# Patient Record
Sex: Female | Born: 1945
Health system: Southern US, Community
[De-identification: ages and names within clinical notes are randomized; demographics above are authoritative.]

## PROBLEM LIST (undated history)

## (undated) DIAGNOSIS — N390 Urinary tract infection, site not specified: Secondary | ICD-10-CM

## (undated) DIAGNOSIS — E785 Hyperlipidemia, unspecified: Secondary | ICD-10-CM

## (undated) DIAGNOSIS — H524 Presbyopia: Secondary | ICD-10-CM

## (undated) DIAGNOSIS — I10 Essential (primary) hypertension: Secondary | ICD-10-CM

## (undated) DIAGNOSIS — E119 Type 2 diabetes mellitus without complications: Secondary | ICD-10-CM

## (undated) DIAGNOSIS — E7800 Pure hypercholesterolemia, unspecified: Secondary | ICD-10-CM

## (undated) HISTORY — DX: Presbyopia: H52.4

## (undated) HISTORY — DX: Type 2 diabetes mellitus without complications: E11.9

## (undated) HISTORY — DX: Essential (primary) hypertension: I10

## (undated) HISTORY — DX: Hyperlipidemia, unspecified: E78.5

## (undated) HISTORY — DX: Urinary tract infection, site not specified: N39.0

## (undated) HISTORY — DX: Pure hypercholesterolemia, unspecified: E78.00

## (undated) HISTORY — PX: EYE SURGERY: SHX253

---

## 2009-04-10 LAB — HM MAMMOGRAPHY

## 2010-05-30 LAB — LIPID PANEL
Cholesterol: 295 mg/dL — AB (ref 0–200)
LDL Cholesterol: 182 mg/dL
Triglycerides: 246 mg/dL — AB (ref 40–160)

## 2011-03-06 ENCOUNTER — Ambulatory Visit: Payer: Self-pay | Admitting: Physician Assistant

## 2011-04-02 ENCOUNTER — Encounter: Payer: Self-pay | Admitting: Physician Assistant

## 2011-04-02 DIAGNOSIS — E1159 Type 2 diabetes mellitus with other circulatory complications: Secondary | ICD-10-CM | POA: Insufficient documentation

## 2011-04-02 DIAGNOSIS — IMO0002 Reserved for concepts with insufficient information to code with codable children: Secondary | ICD-10-CM | POA: Insufficient documentation

## 2011-04-02 DIAGNOSIS — H409 Unspecified glaucoma: Secondary | ICD-10-CM | POA: Insufficient documentation

## 2011-04-02 DIAGNOSIS — I10 Essential (primary) hypertension: Secondary | ICD-10-CM | POA: Insufficient documentation

## 2011-04-02 DIAGNOSIS — E1165 Type 2 diabetes mellitus with hyperglycemia: Secondary | ICD-10-CM | POA: Insufficient documentation

## 2011-04-02 DIAGNOSIS — I152 Hypertension secondary to endocrine disorders: Secondary | ICD-10-CM | POA: Insufficient documentation

## 2011-04-02 DIAGNOSIS — E785 Hyperlipidemia, unspecified: Secondary | ICD-10-CM | POA: Insufficient documentation

## 2011-04-03 ENCOUNTER — Ambulatory Visit (INDEPENDENT_AMBULATORY_CARE_PROVIDER_SITE_OTHER): Payer: Medicare Other | Admitting: Physician Assistant

## 2011-04-03 DIAGNOSIS — E119 Type 2 diabetes mellitus without complications: Secondary | ICD-10-CM

## 2011-04-03 DIAGNOSIS — E782 Mixed hyperlipidemia: Secondary | ICD-10-CM

## 2011-04-03 DIAGNOSIS — I10 Essential (primary) hypertension: Secondary | ICD-10-CM

## 2011-05-07 ENCOUNTER — Telehealth: Payer: Self-pay

## 2011-05-07 NOTE — Telephone Encounter (Signed)
.  [facility] PT STATED Carly Moore WANTED TO KNOW WHEN SHE WAS BACK IN TOWN SO SHE CAN CALL IN HER MEDICINE FOR HER PLEASE CALL PT AT [phone]  [facility] ON [address]

## 2011-05-08 ENCOUNTER — Other Ambulatory Visit: Payer: Self-pay

## 2011-05-08 NOTE — Telephone Encounter (Signed)
What medicine does the patient need?

## 2011-05-08 NOTE — Telephone Encounter (Signed)
Pt needs refill on Metformin 1000 bid, Losartan 100mg , and HCTZ 25 mg called into [facility] on [address]

## 2011-05-08 NOTE — Telephone Encounter (Signed)
LMOM to CB. 

## 2011-05-09 MED ORDER — LOSARTAN POTASSIUM 100 MG PO TABS: 100.0000 mg | ORAL_TABLET | Freq: Every day | ORAL | Status: DC

## 2011-05-09 MED ORDER — METFORMIN HCL 1000 MG PO TABS: 1000.0000 mg | ORAL_TABLET | Freq: Two times a day (BID) | ORAL | Status: DC

## 2011-05-09 MED ORDER — HYDROCHLOROTHIAZIDE 25 MG PO TABS: 25.0000 mg | ORAL_TABLET | Freq: Every day | ORAL | Status: DC

## 2011-05-09 NOTE — Telephone Encounter (Signed)
Meds already addressed... See previous phone message.

## 2011-05-09 NOTE — Telephone Encounter (Signed)
Called pt to verify which Rxs are needed. Pt needs Metformin, Losartan and HCTZ in 90-day amounts. Sent Rxs in with one add'l RF each per protocol.

## 2011-10-02 ENCOUNTER — Ambulatory Visit: Payer: Medicare Other | Admitting: Physician Assistant

## 2011-10-16 ENCOUNTER — Ambulatory Visit: Payer: Medicare Other | Admitting: Physician Assistant

## 2011-10-21 ENCOUNTER — Other Ambulatory Visit: Payer: Self-pay | Admitting: Physician Assistant

## 2011-12-09 ENCOUNTER — Telehealth: Payer: Self-pay

## 2011-12-09 MED ORDER — HYDROCHLOROTHIAZIDE 25 MG PO TABS: 25.0000 mg | ORAL_TABLET | Freq: Every day | ORAL | Status: DC

## 2011-12-09 MED ORDER — METFORMIN HCL 1000 MG PO TABS: 1000.0000 mg | ORAL_TABLET | Freq: Two times a day (BID) | ORAL | Status: DC

## 2011-12-09 MED ORDER — LOSARTAN POTASSIUM 100 MG PO TABS: 100.0000 mg | ORAL_TABLET | Freq: Every day | ORAL | Status: DC

## 2011-12-09 NOTE — Telephone Encounter (Signed)
Patient has appt with Maralyn Sago scheduled, so her meds are sent in for her. She is aware.

## 2011-12-09 NOTE — Telephone Encounter (Signed)
Pt is in [place], [place] and will not be home until later in the week. She is out of all three of her medication, metformin, BP med and another med in which could not remember the name.  Pt has scheduled an appt with Benny Lennert on 12/25/11, but is out now. Please call pt at [phone]. If we can call in meds please call to [facility] in [place] ,[place] @ [phone]

## 2011-12-25 ENCOUNTER — Encounter: Payer: Self-pay | Admitting: Physician Assistant

## 2011-12-25 ENCOUNTER — Ambulatory Visit (INDEPENDENT_AMBULATORY_CARE_PROVIDER_SITE_OTHER): Payer: Medicare Other | Admitting: Physician Assistant

## 2011-12-25 VITALS — BP 140/72 | HR 66 | Temp 97.2°F | Resp 18 | Ht 60.0 in | Wt 170.0 lb

## 2011-12-25 DIAGNOSIS — E7800 Pure hypercholesterolemia, unspecified: Secondary | ICD-10-CM

## 2011-12-25 DIAGNOSIS — E119 Type 2 diabetes mellitus without complications: Secondary | ICD-10-CM

## 2011-12-25 DIAGNOSIS — Z9189 Other specified personal risk factors, not elsewhere classified: Secondary | ICD-10-CM

## 2011-12-25 DIAGNOSIS — Z23 Encounter for immunization: Secondary | ICD-10-CM

## 2011-12-25 DIAGNOSIS — IMO0001 Reserved for inherently not codable concepts without codable children: Secondary | ICD-10-CM

## 2011-12-25 DIAGNOSIS — Z789 Other specified health status: Secondary | ICD-10-CM

## 2011-12-25 LAB — LIPID PANEL
Cholesterol: 311 mg/dL — ABNORMAL HIGH (ref 0–200)
VLDL: 59 mg/dL — ABNORMAL HIGH (ref 0–40)

## 2011-12-25 LAB — COMPREHENSIVE METABOLIC PANEL
ALT: 25 U/L (ref 0–35)
CO2: 27 mEq/L (ref 19–32)
Chloride: 103 mEq/L (ref 96–112)
Potassium: 4.3 mEq/L (ref 3.5–5.3)
Sodium: 137 mEq/L (ref 135–145)
Total Bilirubin: 0.5 mg/dL (ref 0.3–1.2)
Total Protein: 7.3 g/dL (ref 6.0–8.3)

## 2011-12-25 MED ORDER — HYDROCHLOROTHIAZIDE 25 MG PO TABS: 25.0000 mg | ORAL_TABLET | Freq: Every day | ORAL | Status: DC

## 2011-12-25 MED ORDER — LOSARTAN POTASSIUM 100 MG PO TABS: 100.0000 mg | ORAL_TABLET | Freq: Every day | ORAL | Status: DC

## 2011-12-25 MED ORDER — METFORMIN HCL 1000 MG PO TABS: 1000.0000 mg | ORAL_TABLET | Freq: Two times a day (BID) | ORAL | Status: DC

## 2011-12-25 NOTE — Progress Notes (Signed)
   [address], [place] [address]   Phone [phone]  Subjective:    Patient ID: Carly Moore, female    DOB: [birth year], 66 y.o.   MRN: [id number]  HPI Pt presents for recheck of HTN, DM and hypercholesterol.  She has been away for most of the summer.  She has not been eating great and her sugars have been running slightly higher than normal (this am was 190).  She tries to normally eat mostly veggies but with visiting family her diet was not her normal.  She has also forgotten a few doses of her BP meds recently.  She is otherwise good.  Weight is stable.  Beck's depression scale - 2 - no signs of depression Fall risk assessment - low-no fall risk  Review of Systems  Constitutional: Negative for unexpected weight change.  Respiratory: Negative for cough and chest tightness.   Cardiovascular: Negative for chest pain and leg swelling.  Skin: Negative.   Neurological:       No feet paresthesias or burning.       Objective:   Physical Exam  Vitals reviewed. Constitutional: She is oriented to person, place, and time. She appears well-developed and well-nourished.  HENT:  Head: Normocephalic and atraumatic.  Right Ear: External ear normal.  Left Ear: External ear normal.  Nose: Nose normal.  Eyes: Conjunctivae normal are normal.  Neck: Neck supple.  Cardiovascular: Normal rate, regular rhythm and normal heart sounds.   Pulmonary/Chest: Effort normal and breath sounds normal.  Lymphadenopathy:    She has no cervical adenopathy.  Neurological: She is alert and oriented to person, place, and time.  Skin: Skin is warm and dry.       No skin breakdown on feet.  Some callus formation at MTP bilaterally.  Normal sensation with filament testing.  Psychiatric: She has a normal mood and affect. Her behavior is normal. Judgment and thought content normal.          Assessment & Plan:   1. DM (diabetes mellitus)  Comprehensive metabolic panel, Lipid panel, POCT glycosylated  hemoglobin (Hb A1C)  2. Hypercholesterolemia  Comprehensive metabolic panel, Lipid panel  3. Flu vaccine need  Flu vaccine greater than or equal to 3yo preservative free IM   Meds refilled.  Will plan on rechecking pt in 6 months unless her A1C is terrible, then will plan on 3 month recheck.  Pt will schedule her mammo. We did a referral for a bone density for screening. Pt declines pneumovax and tetanus vaccines and declines colonoscopy referral.

## 2012-07-02 ENCOUNTER — Other Ambulatory Visit: Payer: Self-pay | Admitting: Physician Assistant

## 2012-07-30 ENCOUNTER — Telehealth: Payer: Self-pay

## 2012-07-30 MED ORDER — HYDROCHLOROTHIAZIDE 25 MG PO TABS: 25.0000 mg | ORAL_TABLET | Freq: Every day | ORAL | Status: DC

## 2012-07-30 MED ORDER — LOSARTAN POTASSIUM 100 MG PO TABS: ORAL_TABLET | ORAL | Status: DC

## 2012-07-30 MED ORDER — METFORMIN HCL 1000 MG PO TABS: ORAL_TABLET | ORAL | Status: DC

## 2012-07-30 NOTE — Telephone Encounter (Signed)
Pt is scheduled to see Carly Moore on 09/02/12, but wants to know if medications for metformin, lorzaraton and her fluid pill be called in.  Pt is requesting a 3 month supply because she is traveling so much [facility] on [address]

## 2012-07-30 NOTE — Telephone Encounter (Signed)
Sent in one month supply can not send 3 months, since patient overdue for followup

## 2012-09-02 ENCOUNTER — Encounter: Payer: Self-pay | Admitting: Physician Assistant

## 2012-09-02 ENCOUNTER — Ambulatory Visit (INDEPENDENT_AMBULATORY_CARE_PROVIDER_SITE_OTHER): Payer: Medicare Other | Admitting: Physician Assistant

## 2012-09-02 VITALS — BP 163/83 | HR 69 | Temp 98.8°F | Resp 20 | Ht 60.0 in | Wt 169.8 lb

## 2012-09-02 DIAGNOSIS — E119 Type 2 diabetes mellitus without complications: Secondary | ICD-10-CM

## 2012-09-02 DIAGNOSIS — I10 Essential (primary) hypertension: Secondary | ICD-10-CM

## 2012-09-02 DIAGNOSIS — E785 Hyperlipidemia, unspecified: Secondary | ICD-10-CM

## 2012-09-02 LAB — COMPREHENSIVE METABOLIC PANEL
ALT: 33 U/L (ref 0–35)
CO2: 28 mEq/L (ref 19–32)
Creat: 0.92 mg/dL (ref 0.50–1.10)
Total Bilirubin: 0.5 mg/dL (ref 0.3–1.2)

## 2012-09-02 MED ORDER — LOSARTAN POTASSIUM 100 MG PO TABS: 100.0000 mg | ORAL_TABLET | Freq: Every day | ORAL | Status: DC

## 2012-09-02 MED ORDER — HYDROCHLOROTHIAZIDE 25 MG PO TABS: 25.0000 mg | ORAL_TABLET | Freq: Every day | ORAL | Status: DC

## 2012-09-02 MED ORDER — METFORMIN HCL 1000 MG PO TABS: ORAL_TABLET | ORAL | Status: DC

## 2012-09-02 NOTE — Progress Notes (Signed)
   [address], [place] [address]   Phone [phone]  Subjective:    Patient ID: Carly Moore, female    DOB: 07-28-1945, 67 y.o.   MRN: [id number]  HPI Pt presents to clinic for 6 month recheck on her controlled diabetes and HTN.  She is doing well.  She just got home from her 50th high school reunion.  She has been doing ok with her eating but she did have a glucose this am of 183 but she did not eat well the last couple of days due to her travels.    Review of Systems  Constitutional: Negative for fever and chills.  Respiratory: Negative.   Cardiovascular: Negative.   Gastrointestinal:       Heartburn every once in a while - eats an apple and it feels better.  Endocrine: Negative.        Objective:   Physical Exam  Vitals reviewed. Constitutional: She is oriented to person, place, and time. She appears well-developed and well-nourished.  HENT:  Head: Normocephalic and atraumatic.  Right Ear: External ear normal.  Left Ear: External ear normal.  Eyes: Conjunctivae are normal.  Cardiovascular: Normal rate, regular rhythm and normal heart sounds.   No murmur heard. Pulmonary/Chest: Effort normal and breath sounds normal.  Abdominal: Soft.  Neurological: She is alert and oriented to person, place, and time.  Skin: Skin is warm and dry.  Psychiatric: She has a normal mood and affect. Her behavior is normal. Judgment and thought content normal.     Results for orders placed in visit on 09/02/12  POCT GLYCOSYLATED HEMOGLOBIN (HGB A1C)      Result Value Range   Hemoglobin A1C 6.9          Assessment & Plan:  Diabetes mellitus, type II - good control and therefore will continue our q19month appts- Plan: POCT glycosylated hemoglobin (Hb A1C), Microalbumin, urine, Comprehensive metabolic panel, metFORMIN (GLUCOPHAGE) 1000 MG tablet  HTN (hypertension) - Plan: losartan (COZAAR) 100 MG tablet, hydrochlorothiazide (HYDRODIURIL) 25 MG tablet  Benny Lennert PA-C 09/02/2012  5:13 PM

## 2012-09-03 LAB — MICROALBUMIN, URINE: Microalb, Ur: 1.7 mg/dL (ref 0.00–1.89)

## 2013-03-04 ENCOUNTER — Other Ambulatory Visit: Payer: Self-pay | Admitting: Physician Assistant

## 2013-04-04 ENCOUNTER — Other Ambulatory Visit: Payer: Self-pay | Admitting: Physician Assistant

## 2013-04-28 ENCOUNTER — Ambulatory Visit (INDEPENDENT_AMBULATORY_CARE_PROVIDER_SITE_OTHER): Payer: Medicare HMO | Admitting: Physician Assistant

## 2013-04-28 ENCOUNTER — Encounter: Payer: Self-pay | Admitting: Physician Assistant

## 2013-04-28 VITALS — BP 152/74 | HR 72 | Temp 98.7°F | Resp 16 | Ht 60.0 in | Wt 171.0 lb

## 2013-04-28 DIAGNOSIS — Z23 Encounter for immunization: Secondary | ICD-10-CM

## 2013-04-28 DIAGNOSIS — E785 Hyperlipidemia, unspecified: Secondary | ICD-10-CM

## 2013-04-28 DIAGNOSIS — I10 Essential (primary) hypertension: Secondary | ICD-10-CM

## 2013-04-28 DIAGNOSIS — E119 Type 2 diabetes mellitus without complications: Secondary | ICD-10-CM

## 2013-04-28 DIAGNOSIS — Z0000 Encounter for general adult medical examination without abnormal findings: Secondary | ICD-10-CM

## 2013-04-28 DIAGNOSIS — R5381 Other malaise: Secondary | ICD-10-CM

## 2013-04-28 DIAGNOSIS — Z1239 Encounter for other screening for malignant neoplasm of breast: Secondary | ICD-10-CM

## 2013-04-28 DIAGNOSIS — R5383 Other fatigue: Secondary | ICD-10-CM

## 2013-04-28 LAB — COMPLETE METABOLIC PANEL WITH GFR
ALT: 25 U/L (ref 0–35)
AST: 19 U/L (ref 0–37)
Albumin: 4.5 g/dL (ref 3.5–5.2)
Alkaline Phosphatase: 68 U/L (ref 39–117)
BUN: 15 mg/dL (ref 6–23)
CALCIUM: 9.7 mg/dL (ref 8.4–10.5)
CHLORIDE: 101 meq/L (ref 96–112)
CO2: 23 mEq/L (ref 19–32)
CREATININE: 0.9 mg/dL (ref 0.50–1.10)
GFR, EST AFRICAN AMERICAN: 76 mL/min
GFR, Est Non African American: 66 mL/min
Glucose, Bld: 149 mg/dL — ABNORMAL HIGH (ref 70–99)
Potassium: 4 mEq/L (ref 3.5–5.3)
Sodium: 138 mEq/L (ref 135–145)
Total Bilirubin: 0.4 mg/dL (ref 0.2–1.2)
Total Protein: 7.1 g/dL (ref 6.0–8.3)

## 2013-04-28 LAB — LIPID PANEL
CHOLESTEROL: 254 mg/dL — AB (ref 0–200)
HDL: 60 mg/dL (ref >39)
LDL Cholesterol: 145 mg/dL — ABNORMAL HIGH (ref 0–99)
TRIGLYCERIDES: 246 mg/dL — AB (ref <150)
Total CHOL/HDL Ratio: 4.2 Ratio
VLDL: 49 mg/dL — ABNORMAL HIGH (ref 0–40)

## 2013-04-28 LAB — CBC
HEMATOCRIT: 41.2 % (ref 36.0–46.0)
Hemoglobin: 14.3 g/dL (ref 12.0–15.0)
MCH: 31.6 pg (ref 26.0–34.0)
MCHC: 34.7 g/dL (ref 30.0–36.0)
MCV: 91.2 fL (ref 78.0–100.0)
Platelets: 288 10*3/uL (ref 150–400)
RBC: 4.52 MIL/uL (ref 3.87–5.11)
RDW: 13.7 % (ref 11.5–15.5)
WBC: 8.3 10*3/uL (ref 4.0–10.5)

## 2013-04-28 LAB — POCT URINALYSIS DIPSTICK
Bilirubin, UA: NEGATIVE
Blood, UA: NEGATIVE
GLUCOSE UA: NEGATIVE
Ketones, UA: NEGATIVE
Nitrite, UA: NEGATIVE
PH UA: 6
Protein, UA: NEGATIVE
SPEC GRAV UA: 1.015
Urobilinogen, UA: 0.2

## 2013-04-28 LAB — POCT GLYCOSYLATED HEMOGLOBIN (HGB A1C): HEMOGLOBIN A1C: 7.4

## 2013-04-28 LAB — TSH: TSH: 1.16 u[IU]/mL (ref 0.350–4.500)

## 2013-04-28 MED ORDER — METFORMIN HCL 1000 MG PO TABS: ORAL_TABLET | ORAL | Status: DC

## 2013-04-28 MED ORDER — HYDROCHLOROTHIAZIDE 25 MG PO TABS: ORAL_TABLET | ORAL | Status: DC

## 2013-04-28 MED ORDER — LOSARTAN POTASSIUM 100 MG PO TABS: ORAL_TABLET | ORAL | Status: DC

## 2013-04-28 NOTE — Patient Instructions (Signed)
[facility] [address], [place], [place] [id number] [phone]

## 2013-04-29 NOTE — Progress Notes (Signed)
Subjective:    Patient ID: Carly Moore, female    DOB: 10-18-1945, 68 y.o.   MRN: [id number]  HPI  Pt presents to clinic for her annual exam.  She is doing well.  She has been traveling with her cousins and friends.  Patient Active Problem List   Diagnosis Date Noted  . Glaucoma - see eye dr every 6 months   . HTN (hypertension) - controlled on medications - she will monitor at home due to slight increase at today's visit   . Diabetes type 2, uncontrolled -- she will work on improved control with changes in her diet   . Hyperlipidemia - she still does not desire treatment at this time   Se is not interested in Colonoscopy. Last Bone density 10/13 - normal Not interested in pneumococcal vaccine. See dentist every 6 months Last mammogram was several years ago at [facility].  Review of Systems  Constitutional: Negative.   HENT: Negative.   Eyes: Negative.   Respiratory: Negative.   Cardiovascular: Negative.   Gastrointestinal: Negative.   Endocrine: Negative.   Genitourinary: Negative.   Musculoskeletal: Negative.   Skin: Negative.   Allergic/Immunologic: Negative.   Neurological: Negative.   Hematological: Negative.   Psychiatric/Behavioral: Negative.        Objective:   Physical Exam  Vitals reviewed. Constitutional: She is oriented to person, place, and time. She appears well-developed and well-nourished.  HENT:  Head: Normocephalic and atraumatic.  Right Ear: Hearing, tympanic membrane, external ear and ear canal normal.  Left Ear: Hearing, tympanic membrane, external ear and ear canal normal.  Nose: Nose normal.  Mouth/Throat: Uvula is midline, oropharynx is clear and moist and mucous membranes are normal.  Eyes: Conjunctivae and EOM are normal. Pupils are equal, round, and reactive to light.  Neck: Normal range of motion. Neck supple.  Cardiovascular: Normal rate, regular rhythm and normal heart sounds.  Exam reveals no gallop.   Pulmonary/Chest: Effort normal  and breath sounds normal.  Abdominal: Soft. Bowel sounds are normal.  Genitourinary: No breast swelling, tenderness, discharge or bleeding.  Musculoskeletal: Normal range of motion.  Lymphadenopathy:    She has no cervical adenopathy.  Neurological: She is alert and oriented to person, place, and time. She has normal reflexes.  Skin: Skin is warm and dry.  Psychiatric: She has a normal mood and affect. Her behavior is normal. Judgment and thought content normal.   Results for orders placed in visit on 04/28/13  COMPLETE METABOLIC PANEL WITH GFR      Result Value Range   Sodium 138  135 - 145 mEq/L   Potassium 4.0  3.5 - 5.3 mEq/L   Chloride 101  96 - 112 mEq/L   CO2 23  19 - 32 mEq/L   Glucose, Bld 149 (*) 70 - 99 mg/dL   BUN 15  6 - 23 mg/dL   Creat 9.560.90  2.130.50 - 0.861.10 mg/dL   Total Bilirubin 0.4  0.2 - 1.2 mg/dL   Alkaline Phosphatase 68  39 - 117 U/L   AST 19  0 - 37 U/L   ALT 25  0 - 35 U/L   Total Protein 7.1  6.0 - 8.3 g/dL   Albumin 4.5  3.5 - 5.2 g/dL   Calcium 9.7  8.4 - 57.810.5 mg/dL   GFR, Est African American 76     GFR, Est Non African American 66    TSH      Result Value Range   TSH 1.160  0.350 - 4.500 uIU/mL  LIPID PANEL      Result Value Range   Cholesterol 254 (*) 0 - 200 mg/dL   Triglycerides 960 (*) <150 mg/dL   HDL 60  >45 mg/dL   Total CHOL/HDL Ratio 4.2     VLDL 49 (*) 0 - 40 mg/dL   LDL Cholesterol 409 (*) 0 - 99 mg/dL  CBC      Result Value Range   WBC 8.3  4.0 - 10.5 K/uL   RBC 4.52  3.87 - 5.11 MIL/uL   Hemoglobin 14.3  12.0 - 15.0 g/dL   HCT 81.1  91.4 - 78.2 %   MCV 91.2  78.0 - 100.0 fL   MCH 31.6  26.0 - 34.0 pg   MCHC 34.7  30.0 - 36.0 g/dL   RDW 95.6  21.3 - 08.6 %   Platelets 288  150 - 400 K/uL  POCT GLYCOSYLATED HEMOGLOBIN (HGB A1C)      Result Value Range   Hemoglobin A1C 7.4    POCT URINALYSIS DIPSTICK      Result Value Range   Color, UA yellow     Clarity, UA clear     Glucose, UA neg     Bilirubin, UA neg     Ketones, UA  neg     Spec Grav, UA 1.015     Blood, UA neg     pH, UA 6.0     Protein, UA neg     Urobilinogen, UA 0.2     Nitrite, UA neg     Leukocytes, UA Trace         Assessment & Plan:  Medicare annual wellness visit, subsequent  Hyperlipidemia - Plan: Lipid panel  HTN (hypertension) - slightly high today - pt will continue to monitor at home - Plan: COMPLETE METABOLIC PANEL WITH GFR, hydrochlorothiazide (HYDRODIURIL) 25 MG tablet, losartan (COZAAR) 100 MG tablet  Diabetes type 2, controlled - slightly worse control at this visit - she will watch her eating -- Plan: POCT glycosylated hemoglobin (Hb A1C), CBC, POCT urinalysis dipstick, metFORMIN (GLUCOPHAGE) 1000 MG tablet, HM DIABETES FOOT EXAM  Other malaise and fatigue - Plan: TSH  Screening for breast cancer - Plan: MM Digital Screening  Need for prophylactic vaccination and inoculation against influenza - Plan: Flu Vaccine QUAD 36+ mos IM  Still ok to see her in 3 months due to elevated BP.  Benny Lennert PA-C 04/29/2013 5:27 PM

## 2013-05-12 ENCOUNTER — Encounter: Payer: Self-pay | Admitting: Physician Assistant

## 2013-06-09 ENCOUNTER — Encounter: Payer: Self-pay | Admitting: Physician Assistant

## 2013-07-29 ENCOUNTER — Other Ambulatory Visit: Payer: Self-pay | Admitting: Physician Assistant

## 2013-08-04 ENCOUNTER — Ambulatory Visit (INDEPENDENT_AMBULATORY_CARE_PROVIDER_SITE_OTHER): Payer: Medicare HMO | Admitting: Physician Assistant

## 2013-08-04 VITALS — BP 153/75 | HR 61 | Temp 98.1°F | Resp 16 | Ht 60.0 in | Wt 169.0 lb

## 2013-08-04 DIAGNOSIS — I10 Essential (primary) hypertension: Secondary | ICD-10-CM

## 2013-08-04 DIAGNOSIS — E1165 Type 2 diabetes mellitus with hyperglycemia: Secondary | ICD-10-CM

## 2013-08-04 DIAGNOSIS — IMO0002 Reserved for concepts with insufficient information to code with codable children: Secondary | ICD-10-CM

## 2013-08-04 DIAGNOSIS — IMO0001 Reserved for inherently not codable concepts without codable children: Secondary | ICD-10-CM

## 2013-08-04 LAB — POCT GLYCOSYLATED HEMOGLOBIN (HGB A1C): Hemoglobin A1C: 7.3

## 2013-08-04 NOTE — Progress Notes (Signed)
   Subjective:    Patient ID: Carly Moore, female    DOB: Oct 17, 1945, 68 y.o.   MRN: [id number]  HPI  Pt presents to clinic for her 3 months recheck.  Her A1C at her last visit is higher than it normally is but she has been watching her sugar intake a little closer. She has been traveling to visit family and has multiple more trips planned for the summer.  She does not check her glucose or her BP at home.  She feels good.  Review of Systems  Cardiovascular: Negative for chest pain and leg swelling.       Objective:   Physical Exam  Vitals reviewed. Constitutional: She is oriented to person, place, and time. She appears well-developed and well-nourished.  HENT:  Head: Normocephalic and atraumatic.  Right Ear: External ear normal.  Left Ear: External ear normal.  Eyes: Conjunctivae are normal.  Neck: Normal range of motion.  Cardiovascular: Normal rate, regular rhythm and normal heart sounds.   No murmur heard. Pulmonary/Chest: Effort normal and breath sounds normal. She has no wheezes.  Musculoskeletal:  Normal DM foot exam  Neurological: She is alert and oriented to person, place, and time.  Skin: Skin is warm and dry.  Psychiatric: She has a normal mood and affect. Her behavior is normal. Judgment and thought content normal.   Results for orders placed in visit on 08/04/13  POCT GLYCOSYLATED HEMOGLOBIN (HGB A1C)      Result Value Ref Range   Hemoglobin A1C 7.3         Assessment & Plan:  Diabetes type 2, uncontrolled - She states that she is using Glucophage 100mg  in the am and 500mg  in the pm.  Her control is not where it should be but she states that she feels better on this reduced dose so I think that if she feels better it is ok to continue and we will see her back in 6 months unless she needs me sooner.  Plan: COMPLETE METABOLIC PANEL WITH GFR, POCT glycosylated hemoglobin (Hb A1C), HM DIABETES FOOT EXAM  HTN (hypertension) - her systolic is slightly elevated but  with nor diastolic I am concerned about adding other medications because I do not want her to experience hypotension and possible fall and get hurt.  Benny LennertSarah Blossie Raffel PA-C  [facility] 08/04/2013 2:51 PM

## 2013-08-05 LAB — COMPLETE METABOLIC PANEL WITH GFR
ALBUMIN: 4.5 g/dL (ref 3.5–5.2)
ALK PHOS: 75 U/L (ref 39–117)
ALT: 25 U/L (ref 0–35)
AST: 21 U/L (ref 0–37)
BUN: 15 mg/dL (ref 6–23)
CO2: 25 meq/L (ref 19–32)
Calcium: 9.7 mg/dL (ref 8.4–10.5)
Chloride: 101 mEq/L (ref 96–112)
Creat: 0.96 mg/dL (ref 0.50–1.10)
GFR, EST NON AFRICAN AMERICAN: 61 mL/min
GFR, Est African American: 70 mL/min
GLUCOSE: 142 mg/dL — AB (ref 70–99)
Potassium: 4.3 mEq/L (ref 3.5–5.3)
SODIUM: 140 meq/L (ref 135–145)
TOTAL PROTEIN: 7.7 g/dL (ref 6.0–8.3)
Total Bilirubin: 0.5 mg/dL (ref 0.2–1.2)

## 2013-08-18 ENCOUNTER — Ambulatory Visit: Payer: Medicare HMO | Admitting: Physician Assistant

## 2013-10-26 ENCOUNTER — Other Ambulatory Visit: Payer: Self-pay | Admitting: Physician Assistant

## 2014-01-31 ENCOUNTER — Other Ambulatory Visit: Payer: Self-pay | Admitting: Physician Assistant

## 2014-02-03 ENCOUNTER — Ambulatory Visit (INDEPENDENT_AMBULATORY_CARE_PROVIDER_SITE_OTHER): Payer: Medicare HMO

## 2014-02-03 ENCOUNTER — Ambulatory Visit (INDEPENDENT_AMBULATORY_CARE_PROVIDER_SITE_OTHER): Payer: Medicare HMO | Admitting: Family Medicine

## 2014-02-03 ENCOUNTER — Ambulatory Visit: Payer: Medicare HMO | Admitting: Physician Assistant

## 2014-02-03 VITALS — BP 140/70 | HR 59 | Temp 98.0°F | Resp 16 | Ht 60.0 in | Wt 170.6 lb

## 2014-02-03 DIAGNOSIS — IMO0002 Reserved for concepts with insufficient information to code with codable children: Secondary | ICD-10-CM

## 2014-02-03 DIAGNOSIS — M79645 Pain in left finger(s): Secondary | ICD-10-CM

## 2014-02-03 DIAGNOSIS — I10 Essential (primary) hypertension: Secondary | ICD-10-CM

## 2014-02-03 DIAGNOSIS — Z23 Encounter for immunization: Secondary | ICD-10-CM

## 2014-02-03 DIAGNOSIS — E1165 Type 2 diabetes mellitus with hyperglycemia: Secondary | ICD-10-CM

## 2014-02-03 LAB — COMPLETE METABOLIC PANEL WITH GFR
ALBUMIN: 4.2 g/dL (ref 3.5–5.2)
ALT: 22 U/L (ref 0–35)
AST: 19 U/L (ref 0–37)
Alkaline Phosphatase: 70 U/L (ref 39–117)
BUN: 9 mg/dL (ref 6–23)
CALCIUM: 9.9 mg/dL (ref 8.4–10.5)
CO2: 23 mEq/L (ref 19–32)
Chloride: 104 mEq/L (ref 96–112)
Creat: 0.95 mg/dL (ref 0.50–1.10)
GFR, EST AFRICAN AMERICAN: 71 mL/min
GFR, Est Non African American: 61 mL/min
Glucose, Bld: 134 mg/dL — ABNORMAL HIGH (ref 70–99)
POTASSIUM: 4.4 meq/L (ref 3.5–5.3)
Sodium: 138 mEq/L (ref 135–145)
Total Bilirubin: 0.4 mg/dL (ref 0.2–1.2)
Total Protein: 7.1 g/dL (ref 6.0–8.3)

## 2014-02-03 LAB — POCT GLYCOSYLATED HEMOGLOBIN (HGB A1C): Hemoglobin A1C: 7.7

## 2014-02-03 MED ORDER — METFORMIN HCL 1000 MG PO TABS: ORAL_TABLET | ORAL | Status: DC

## 2014-02-03 MED ORDER — LOSARTAN POTASSIUM 100 MG PO TABS: 100.0000 mg | ORAL_TABLET | Freq: Every day | ORAL | Status: DC

## 2014-02-03 MED ORDER — HYDROCHLOROTHIAZIDE 25 MG PO TABS: ORAL_TABLET | ORAL | Status: DC

## 2014-02-03 NOTE — Progress Notes (Signed)
   Subjective:    Patient ID: Carly Moore, female    DOB: 09/19/1945, 68 y.o.   MRN: [id number]  HPI Pt presents to clinic for her 6 month DM check and pain in her left middle finger since June when she tripped on a rug and landed on it funny.  She has had a good summer - doing her normal amount of traveling - she knows that her diet has not been the best but she also knows that it is not the worst that it has been.  She has an aching pain in her finger at the PIP and MCP of the 3rd digit - she notices some swelling and stiffness and generalized weakness but it is not stopping her from doing anything and it is not bad enough for her to take medications.   Review of Systems  Respiratory: Negative for cough.   Cardiovascular: Negative for chest pain and leg swelling.  Musculoskeletal: Positive for joint swelling.       Objective:   Physical Exam  Constitutional: She is oriented to person, place, and time. She appears well-developed and well-nourished.  BP 140/70 mmHg  Pulse 59  Temp(Src) 98 F (36.7 C) (Oral)  Resp 16  Ht 5' (1.524 m)  Wt 170 lb 9.6 oz (77.384 kg)  BMI 33.32 kg/m2  SpO2 96%   HENT:  Head: Normocephalic and atraumatic.  Right Ear: External ear normal.  Left Ear: External ear normal.  Eyes: Conjunctivae are normal.  Cardiovascular: Normal rate, regular rhythm and normal heart sounds.   No murmur heard. Pulmonary/Chest: Effort normal. She has no wheezes.  Musculoskeletal:       Left hand: She exhibits decreased range of motion (slight decrease in flexion of the PIP), tenderness (over MCP and radial aspect of her PIP -- left 3rd digit) and swelling (mild around MCP 3rd digit ). Normal strength noted. She exhibits no thumb/finger opposition.  Slightly decrease hand grip on the left compared to right (she is R hand dominant)  Neurological: She is alert and oriented to person, place, and time.  Skin: Skin is warm and dry.  Normal DM foot exam  Psychiatric: She has  a normal mood and affect. Her behavior is normal. Judgment and thought content normal.  Vitals reviewed.  [facility] reading (PRIMARY) by  Dr. Katrinka BlazingSmith.  neg  Results for orders placed or performed in visit on 02/03/14  POCT glycosylated hemoglobin (Hb A1C)  Result Value Ref Range   Hemoglobin A1C 7.7        Assessment & Plan:  Diabetes type 2, uncontrolled - Slightly worse DM control.  She is going to work more on her healthy lifestyle.  Plan: POCT glycosylated hemoglobin (Hb A1C), metFORMIN (GLUCOPHAGE) 1000 MG tablet, HM DIABETES FOOT EXAM  Need for influenza vaccination - Plan: Flu Vaccine QUAD 36+ mos IM  Essential hypertension - Plan: COMPLETE METABOLIC PANEL WITH GFR, losartan (COZAAR) 100 MG tablet, hydrochlorothiazide (HYDRODIURIL) 25 MG tablet  Finger pain, left - Plan: DG Finger Middle Left - there is no abnl on xrays - she will continue to monitor.  Offered Hand PT today and patients wants to wait.  Benny LennertSarah Weber PA-C  [facility] 02/03/2014 7:01 PM

## 2014-02-06 NOTE — Progress Notes (Signed)
History and physical examinations reviewed with Carly LennertSarah Weber, PA-C.  Xray reviewed and negative for acute process.  Agree with assessment and plan.

## 2014-07-01 ENCOUNTER — Telehealth: Payer: Self-pay

## 2014-07-01 NOTE — Telephone Encounter (Signed)
LMOM to CB. Received a fax from [employer]. Dr. Katrinka BlazingSmith wanted us to give pt a call and let her know she is due for a follow up with Maralyn SagoSarah. [employer] form in [employer] box.

## 2014-07-04 NOTE — Telephone Encounter (Signed)
Spoke with pt. She will come in this Friday to see Maralyn SagoSarah.

## 2014-09-03 ENCOUNTER — Ambulatory Visit (INDEPENDENT_AMBULATORY_CARE_PROVIDER_SITE_OTHER): Payer: Medicare HMO | Admitting: Physician Assistant

## 2014-09-03 VITALS — BP 124/80 | HR 64 | Temp 98.5°F | Ht 60.0 in | Wt 169.5 lb

## 2014-09-03 DIAGNOSIS — E1165 Type 2 diabetes mellitus with hyperglycemia: Secondary | ICD-10-CM | POA: Diagnosis not present

## 2014-09-03 DIAGNOSIS — I10 Essential (primary) hypertension: Secondary | ICD-10-CM

## 2014-09-03 DIAGNOSIS — IMO0002 Reserved for concepts with insufficient information to code with codable children: Secondary | ICD-10-CM

## 2014-09-03 LAB — COMPLETE METABOLIC PANEL WITH GFR
ALT: 29 U/L (ref 0–35)
AST: 21 U/L (ref 0–37)
Albumin: 4.3 g/dL (ref 3.5–5.2)
Alkaline Phosphatase: 70 U/L (ref 39–117)
BUN: 11 mg/dL (ref 6–23)
CO2: 26 mEq/L (ref 19–32)
CREATININE: 1.04 mg/dL (ref 0.50–1.10)
Calcium: 9.3 mg/dL (ref 8.4–10.5)
Chloride: 103 mEq/L (ref 96–112)
GFR, Est African American: 63 mL/min
GFR, Est Non African American: 55 mL/min — ABNORMAL LOW
GLUCOSE: 202 mg/dL — AB (ref 70–99)
Potassium: 4.2 mEq/L (ref 3.5–5.3)
Sodium: 139 mEq/L (ref 135–145)
Total Bilirubin: 0.4 mg/dL (ref 0.2–1.2)
Total Protein: 7 g/dL (ref 6.0–8.3)

## 2014-09-03 LAB — POCT GLYCOSYLATED HEMOGLOBIN (HGB A1C): Hemoglobin A1C: 8.3

## 2014-09-03 MED ORDER — HYDROCHLOROTHIAZIDE 25 MG PO TABS: ORAL_TABLET | ORAL | Status: DC

## 2014-09-03 MED ORDER — METFORMIN HCL 1000 MG PO TABS: ORAL_TABLET | ORAL | Status: DC

## 2014-09-03 MED ORDER — LOSARTAN POTASSIUM 100 MG PO TABS: 100.0000 mg | ORAL_TABLET | Freq: Every day | ORAL | Status: DC

## 2014-09-03 NOTE — Patient Instructions (Signed)
Keep up the good work

## 2014-09-03 NOTE — Progress Notes (Signed)
   Carly Moore  MRN: [id number] DOB: May 15, 1945  Subjective:  Pt presents to clinic for a recheck and medical refill for her DM and HTN.  She has been doing well and has no complaints.  She has not been traveling as much due to daughter in law recently moved in when they sold their house really fast prior to their move.   Patient Active Problem List   Diagnosis Date Noted  . Glaucoma   . HTN (hypertension)   . Diabetes type 2, uncontrolled   . Hyperlipidemia     Current Outpatient Prescriptions on File Prior to Visit  Medication Sig Dispense Refill  . Calcium-Magnesium-Vitamin D (CALCIUM MAGNESIUM PO) Take by mouth daily.    Marland Kitchen co-enzyme Q-10 30 MG capsule Take 30 mg by mouth 3 (three) times daily.      . fish oil-omega-3 fatty acids 1000 MG capsule Take 2 g by mouth daily.      Marland Kitchen glucosamine-chondroitin 500-400 MG tablet Take 1 tablet by mouth 3 (three) times daily.      Marland Kitchen VITAMIN D, CHOLECALCIFEROL, PO Take by mouth daily.     No current facility-administered medications on file prior to visit.    Allergies  Allergen Reactions  . Sulfa Antibiotics Rash    Review of Systems  Respiratory: Negative for cough.   Cardiovascular: Negative for chest pain and palpitations.  Endocrine: Negative.   Genitourinary: Negative.    Objective:  BP 124/80 mmHg  Pulse 64  Temp(Src) 98.5 F (36.9 C) (Oral)  Ht 5' (1.524 m)  Wt 169 lb 8 oz (76.885 kg)  BMI 33.10 kg/m2  SpO2 98%  Physical Exam  Constitutional: She is oriented to person, place, and time and well-developed, well-nourished, and in no distress.  HENT:  Head: Normocephalic and atraumatic.  Right Ear: External ear normal.  Left Ear: External ear normal.  Cardiovascular: Normal rate, regular rhythm and normal heart sounds.   Pulmonary/Chest: Effort normal and breath sounds normal.  Abdominal: Soft.  Neurological: She is alert and oriented to person, place, and time. Gait normal.  Diabetic Foot Exam - Simple   Simple  Foot Form  Diabetic Foot exam was performed with the following findings:  Yes   Visual Inspection  No deformities, no ulcerations, no other skin breakdown bilaterally:  Yes  Sensation Testing  Intact to touch and monofilament testing bilaterally:  Yes  Pulse Check  Posterior Tibialis and Dorsalis pulse intact bilaterally:  Yes  Comments   Skin: Skin is warm and dry.  Psychiatric: Mood, memory, affect and judgment normal.    Assessment and Plan :  Essential hypertension - Plan: COMPLETE METABOLIC PANEL WITH GFR, hydrochlorothiazide (HYDRODIURIL) 25 MG tablet, losartan (COZAAR) 100 MG tablet  Diabetes type 2, uncontrolled - Plan: POCT glycosylated hemoglobin (Hb A1C), metFORMIN (GLUCOPHAGE) 1000 MG tablet, HM DIABETES FOOT EXAM   Continue current medications.  Pt declines cholesterol testing due to she does not plan on treating it.  She will f/u in 6 months due to good control unless her A1C is really high when her labs return.  Benny Lennert PA-C  [facility] 09/03/2014 3:35 PM

## 2014-09-04 ENCOUNTER — Encounter: Payer: Self-pay | Admitting: Physician Assistant

## 2014-10-27 ENCOUNTER — Encounter: Payer: Self-pay | Admitting: *Deleted

## 2015-02-09 DIAGNOSIS — H401131 Primary open-angle glaucoma, bilateral, mild stage: Secondary | ICD-10-CM | POA: Diagnosis not present

## 2015-02-09 DIAGNOSIS — H35371 Puckering of macula, right eye: Secondary | ICD-10-CM | POA: Diagnosis not present

## 2015-02-09 DIAGNOSIS — E119 Type 2 diabetes mellitus without complications: Secondary | ICD-10-CM | POA: Diagnosis not present

## 2015-02-09 LAB — HM DIABETES EYE EXAM

## 2015-02-24 ENCOUNTER — Other Ambulatory Visit: Payer: Self-pay | Admitting: Physician Assistant

## 2015-03-07 ENCOUNTER — Ambulatory Visit (INDEPENDENT_AMBULATORY_CARE_PROVIDER_SITE_OTHER): Payer: Medicare HMO | Admitting: Physician Assistant

## 2015-03-07 ENCOUNTER — Encounter: Payer: Self-pay | Admitting: Physician Assistant

## 2015-03-07 VITALS — BP 138/70 | HR 85 | Temp 98.3°F | Resp 16 | Ht 60.0 in | Wt 163.0 lb

## 2015-03-07 DIAGNOSIS — IMO0001 Reserved for inherently not codable concepts without codable children: Secondary | ICD-10-CM

## 2015-03-07 DIAGNOSIS — Z23 Encounter for immunization: Secondary | ICD-10-CM

## 2015-03-07 DIAGNOSIS — E1165 Type 2 diabetes mellitus with hyperglycemia: Secondary | ICD-10-CM | POA: Diagnosis not present

## 2015-03-07 DIAGNOSIS — I10 Essential (primary) hypertension: Secondary | ICD-10-CM | POA: Diagnosis not present

## 2015-03-07 DIAGNOSIS — E119 Type 2 diabetes mellitus without complications: Secondary | ICD-10-CM | POA: Diagnosis not present

## 2015-03-07 LAB — COMPLETE METABOLIC PANEL WITH GFR
ALT: 25 U/L (ref 6–29)
AST: 21 U/L (ref 10–35)
Albumin: 4 g/dL (ref 3.6–5.1)
Alkaline Phosphatase: 73 U/L (ref 33–130)
BILIRUBIN TOTAL: 0.5 mg/dL (ref 0.2–1.2)
BUN: 17 mg/dL (ref 7–25)
CO2: 26 mmol/L (ref 20–31)
Calcium: 9.9 mg/dL (ref 8.6–10.4)
Chloride: 97 mmol/L — ABNORMAL LOW (ref 98–110)
Creat: 1.1 mg/dL — ABNORMAL HIGH (ref 0.60–0.93)
GFR, EST NON AFRICAN AMERICAN: 51 mL/min — AB (ref >=60)
GFR, Est African American: 59 mL/min — ABNORMAL LOW (ref >=60)
GLUCOSE: 217 mg/dL — AB (ref 65–99)
Potassium: 4.5 mmol/L (ref 3.5–5.3)
SODIUM: 137 mmol/L (ref 135–146)
TOTAL PROTEIN: 7.1 g/dL (ref 6.1–8.1)

## 2015-03-07 MED ORDER — METFORMIN HCL 1000 MG PO TABS: ORAL_TABLET | ORAL | Status: DC

## 2015-03-07 MED ORDER — LOSARTAN POTASSIUM 100 MG PO TABS: 100.0000 mg | ORAL_TABLET | Freq: Every day | ORAL | Status: DC

## 2015-03-07 MED ORDER — HYDROCHLOROTHIAZIDE 25 MG PO TABS: ORAL_TABLET | ORAL | Status: DC

## 2015-03-07 NOTE — Progress Notes (Signed)
Carly Moore  MRN: [id number] DOB: September 18, 1945  Subjective:  Pt presents to clinic for her 6 months recheck for her DM and HTN.  She is doing well and has no complaints.  She is having problems with dry eyes and she is working with her eye doctor on these.  She is trying to get off the metformin by doing high fiber and high protein shakes - she finds when she does these her glucose is more stable -   Patient Active Problem List   Diagnosis Date Noted  . Glaucoma   . HTN (hypertension)   . Diabetes type 2, uncontrolled (HCC)   . Hyperlipidemia     Current Outpatient Prescriptions on File Prior to Visit  Medication Sig Dispense Refill  . Calcium-Magnesium-Vitamin D (CALCIUM MAGNESIUM PO) Take by mouth daily.    Marland Kitchen co-enzyme Q-10 30 MG capsule Take 30 mg by mouth 3 (three) times daily.      . fish oil-omega-3 fatty acids 1000 MG capsule Take 2 g by mouth daily.      Marland Kitchen glucosamine-chondroitin 500-400 MG tablet Take 1 tablet by mouth 3 (three) times daily.      Marland Kitchen VITAMIN D, CHOLECALCIFEROL, PO Take by mouth daily.     No current facility-administered medications on file prior to visit.    Allergies  Allergen Reactions  . Sulfa Antibiotics Rash    Review of Systems  Constitutional: Negative for fever and chills.  Respiratory: Negative for shortness of breath.   Cardiovascular: Negative for chest pain, palpitations and leg swelling.  Skin: Negative for wound.  Neurological: Negative for numbness.   Objective:  BP 138/70 mmHg  Pulse 85  Temp(Src) 98.3 F (36.8 C)  Resp 16  Ht 5' (1.524 m)  Wt 163 lb (73.936 kg)  BMI 31.83 kg/m2  Physical Exam  Constitutional: She is oriented to person, place, and time and well-developed, well-nourished, and in no distress.  HENT:  Head: Normocephalic and atraumatic.  Right Ear: Hearing and external ear normal.  Left Ear: Hearing and external ear normal.  Eyes: Conjunctivae are normal.  Neck: Normal range of motion.  Cardiovascular:  Normal rate, regular rhythm and normal heart sounds.   No murmur heard. Pulmonary/Chest: Effort normal and breath sounds normal.  Musculoskeletal:       Right lower leg: She exhibits no edema.       Left lower leg: She exhibits no edema.  Neurological: She is alert and oriented to person, place, and time. Gait normal.  Skin: Skin is warm and dry.  Diabetic Foot Exam - Simple   Simple Foot Form  Diabetic Foot exam was performed with the following findings:  Yes  03/07/2015 10:00 AM  Visual Inspection  No deformities, no ulcerations, no other skin breakdown bilaterally:  Yes  Sensation Testing  Intact to touch and monofilament testing bilaterally:  Yes  Pulse Check  Posterior Tibialis and Dorsalis pulse intact bilaterally:  Yes  Comments      Psychiatric: Mood, memory, affect and judgment normal.  Vitals reviewed.   Assessment and Plan :  Essential hypertension - Plan: COMPLETE METABOLIC PANEL WITH GFR, losartan (COZAAR) 100 MG tablet, hydrochlorothiazide (HYDRODIURIL) 25 MG tablet  Need for prophylactic vaccination and inoculation against influenza - Plan: Flu Vaccine QUAD 36+ mos IM  Uncontrolled type 2 diabetes mellitus without complication, without long-term current use of insulin (HCC) - Plan: Hemoglobin A1c, Microalbumin, urine, metFORMIN (GLUCOPHAGE) 1000 MG tablet   Continue current medications and recheck in 6  months.    Benny LennertSarah Primrose Oler PA-C  [facility] 03/07/2015 10:43 AM

## 2015-03-08 LAB — HEMOGLOBIN A1C
Hgb A1c MFr Bld: 8.9 % — ABNORMAL HIGH (ref <5.7)
Mean Plasma Glucose: 209 mg/dL — ABNORMAL HIGH (ref <117)

## 2015-03-09 ENCOUNTER — Encounter: Payer: Self-pay | Admitting: Physician Assistant

## 2015-08-28 ENCOUNTER — Telehealth: Payer: Self-pay

## 2015-08-28 ENCOUNTER — Other Ambulatory Visit: Payer: Self-pay | Admitting: Physician Assistant

## 2015-08-28 NOTE — Telephone Encounter (Signed)
Patient needs her losartan (COZAAR) 100 MG tablet refilled stated she would try to come in this week to see Maralyn SagoSarah but she is almost out of medicine.  [facility] the [facility] [address]

## 2015-08-30 NOTE — Telephone Encounter (Signed)
Notified pt and gave her Carly Moore's sched next week.

## 2015-10-26 ENCOUNTER — Ambulatory Visit (INDEPENDENT_AMBULATORY_CARE_PROVIDER_SITE_OTHER): Payer: Medicare HMO | Admitting: Physician Assistant

## 2015-10-26 VITALS — BP 128/80 | HR 86 | Temp 98.2°F | Resp 18 | Ht 60.0 in | Wt 165.4 lb

## 2015-10-26 DIAGNOSIS — I10 Essential (primary) hypertension: Secondary | ICD-10-CM | POA: Diagnosis not present

## 2015-10-26 DIAGNOSIS — E1165 Type 2 diabetes mellitus with hyperglycemia: Secondary | ICD-10-CM | POA: Diagnosis not present

## 2015-10-26 DIAGNOSIS — E785 Hyperlipidemia, unspecified: Secondary | ICD-10-CM

## 2015-10-26 DIAGNOSIS — IMO0001 Reserved for inherently not codable concepts without codable children: Secondary | ICD-10-CM

## 2015-10-26 MED ORDER — METFORMIN HCL 1000 MG PO TABS: ORAL_TABLET | ORAL | 1 refills | Status: DC

## 2015-10-26 MED ORDER — LOSARTAN POTASSIUM 100 MG PO TABS: 100.0000 mg | ORAL_TABLET | Freq: Every day | ORAL | 0 refills | Status: DC

## 2015-10-26 MED ORDER — LOSARTAN POTASSIUM 100 MG PO TABS: 100.0000 mg | ORAL_TABLET | Freq: Every day | ORAL | 1 refills | Status: DC

## 2015-10-26 MED ORDER — HYDROCHLOROTHIAZIDE 25 MG PO TABS: ORAL_TABLET | ORAL | 1 refills | Status: DC

## 2015-10-26 NOTE — Patient Instructions (Addendum)
     IF you received an x-ray today, you will receive an invoice from [facility]. Please contact [facility] at [phone] with questions or concerns regarding your invoice.   IF you received labwork today, you will receive an invoice from [facility]. Please contact [facility] at [phone] with questions or concerns regarding your invoice.   Our billing staff will not be able to assist you with questions regarding bills from these companies.  You will be contacted with the lab results as soon as they are available. The fastest way to get your results is to activate your My Chart account. Instructions are located on the last page of this paperwork. If you have not heard from us regarding the results in 2 weeks, please contact this office.     We recommend that you schedule a mammogram for breast cancer screening. Typically, you do not need a referral to do this. Please contact a local imaging center to schedule your mammogram.  [facility] - [phone]  *ask for the Radiology Department [facility]) - [phone] or [phone]  [facility] - [phone] [facility] - [phone] [facility] - [phone]  *ask for the Radiology Department [facility] - [phone]  *ask for the Radiology Department [facility] - [phone]  *ask for the Mammography Department [facility] - [phone]  

## 2015-10-26 NOTE — Progress Notes (Signed)
Subjective:    Patient ID: Carly Moore, female    DOB: [birth year], 70 y.o.   MRN: [id number]  HPI  Carly Moore is here for follow up on diabetes, HTN, and hyperlipidemia. She is currently taking losartan and HCTZ for her HTN. She takes metformin for her diabetes, and is not interested in taking anything else for this. She takes a fish oil omega-3 fatty acid supplement, but is not interested in taking anything else for her hyperlipidemia.  She does not check her blood glucose regularly at home but notes that her highest reading in the last few months has been 135 mg/dL. She feels like it has possibly been higher over the last 2 weeks because she had her birthday and also went to a baby shower for her granddaughter where she ate some sweets and "other foods she's not supposed to have". She does have a little tingling in her little toe on her right foot from time to time, but denies any numbness in either foot or in her hands. She checks her feet daily and denies any sores. She does get up to urinate a few times at night, but attributes this more to her age. She denies any chest pain, SOB, palpitations, or headaches. She is trying a smoothie recipe she found to help with her diabetes using natural ingredients.   Stopped taking an eye drop COSOPT, because she felt like it was giving her dry eyes. She plans on discussing this with her eye doctor when she sees him soon.  Last mammogram was in 2015.  Review of Systems  Respiratory: Negative for cough.   Cardiovascular: Positive for leg swelling (Ankles swell with extended sitting or travel, relieved with rest and elevation.).  Gastrointestinal: Negative for abdominal pain.  Genitourinary: Negative for difficulty urinating.  Skin: Negative for wound.  Psychiatric/Behavioral: Negative for dysphoric mood. The patient is not nervous/anxious.     Current Outpatient Prescriptions:  .  Calcium-Magnesium-Vitamin D (CALCIUM MAGNESIUM PO), Take by mouth  daily., Disp: , Rfl:  .  co-enzyme Q-10 30 MG capsule, Take 30 mg by mouth 3 (three) times daily.  , Disp: , Rfl:  .  dorzolamide-timolol (COSOPT) 22.3-6.8 MG/ML ophthalmic solution, , Disp: , Rfl:  .  fish oil-omega-3 fatty acids 1000 MG capsule, Take 2 g by mouth daily.  , Disp: , Rfl:  .  glucosamine-chondroitin 500-400 MG tablet, Take 1 tablet by mouth 3 (three) times daily.  , Disp: , Rfl:  .  hydrochlorothiazide (HYDRODIURIL) 25 MG tablet, TAKE ONE TABLET BY MOUTH ONCE DAILY, Disp: 90 tablet, Rfl: 1 .  latanoprost (XALATAN) 0.005 % ophthalmic solution, , Disp: , Rfl:  .  losartan (COZAAR) 100 MG tablet, TAKE ONE TABLET BY MOUTH ONCE DAILY PATIENT  NEEDS  OFFICE  VISIT  FOR  ADDITIONAL  REFILLS, Disp: 30 tablet, Rfl: 0 .  metFORMIN (GLUCOPHAGE) 1000 MG tablet, TAKE ONE TABLET BY MOUTH TWICE DAILY, Disp: 180 tablet, Rfl: 1 .  VITAMIN D, CHOLECALCIFEROL, PO, Take by mouth daily., Disp: , Rfl:    Allergies  Allergen Reactions  . Sulfa Antibiotics Rash      Objective:   Physical Exam  Constitutional: She is oriented to person, place, and time. She appears well-developed and well-nourished. She is cooperative. No distress.  Blood pressure 128/80, pulse 86, temperature 98.2 F (36.8 C), temperature source Oral, resp. rate 18, height 5' (1.524 m), weight 165 lb 6.4 oz (75 kg), SpO2 97 %.  HENT:  Head: Normocephalic and atraumatic.  Eyes: Conjunctivae and lids are normal.  Neck: Trachea normal and normal range of motion. Neck supple. No thyromegaly present.  Cardiovascular: Normal rate, regular rhythm and normal heart sounds.   Pulses:      Radial pulses are 2+ on the right side, and 2+ on the left side.       Dorsalis pedis pulses are 2+ on the right side, and 2+ on the left side.       Posterior tibial pulses are 2+ on the right side, and 2+ on the left side.  Pulmonary/Chest: Effort normal and breath sounds normal.  Musculoskeletal: Normal range of motion.  Lymphadenopathy:    She  has no cervical adenopathy.  Neurological: She is alert and oriented to person, place, and time. She has normal strength. No sensory deficit.  Skin: Skin is warm, dry and intact. She is not diaphoretic. No cyanosis. No pallor. Nails show no clubbing.  Psychiatric: She has a normal mood and affect. Her speech is normal and behavior is normal. Judgment and thought content normal. Cognition and memory are normal.    Diabetic Foot Exam - Simple   Simple Foot Form Diabetic Foot exam was performed with the following findings:  Yes 10/26/2015 11:35 AM  Visual Inspection No deformities, no ulcerations, no other skin breakdown bilaterally:  Yes Sensation Testing Intact to touch and monofilament testing bilaterally:  Yes Pulse Check Posterior Tibialis and Dorsalis pulse intact bilaterally:  Yes Comments       Assessment & Plan:  1. Essential hypertension - well controlled - Comprehensive metabolic panel - hydrochlorothiazide (HYDRODIURIL) 25 MG tablet; TAKE ONE TABLET BY MOUTH ONCE DAILY  Dispense: 90 tablet; Refill: 1 - losartan (COZAAR) 100 MG tablet; Take 1 tablet (100 mg total) by mouth daily.  Dispense: 90 tablet; Refill: 1  2. Uncontrolled type 2 diabetes mellitus without complication, without long-term current use of insulin (HCC) - Comprehensive metabolic panel - Hemoglobin A1c - metFORMIN (GLUCOPHAGE) 1000 MG tablet; TAKE ONE TABLET BY MOUTH TWICE DAILY  Dispense: 180 tablet; Refill: 1 - diabetic foot exam done today  3. Hyperlipidemia - Lipid panel - patient is not interested in medical treatment for this  Follow up in 6 months.  Velena Keegan Rayburn PA-S 10/26/15

## 2015-10-27 LAB — COMPREHENSIVE METABOLIC PANEL
ALT: 36 U/L — AB (ref 6–29)
AST: 32 U/L (ref 10–35)
Albumin: 4.5 g/dL (ref 3.6–5.1)
Alkaline Phosphatase: 69 U/L (ref 33–130)
BUN: 12 mg/dL (ref 7–25)
CHLORIDE: 100 mmol/L (ref 98–110)
CO2: 23 mmol/L (ref 20–31)
Calcium: 10.2 mg/dL (ref 8.6–10.4)
Creat: 1.02 mg/dL — ABNORMAL HIGH (ref 0.60–0.93)
Glucose, Bld: 160 mg/dL — ABNORMAL HIGH (ref 65–99)
POTASSIUM: 4.4 mmol/L (ref 3.5–5.3)
Sodium: 137 mmol/L (ref 135–146)
TOTAL PROTEIN: 7.8 g/dL (ref 6.1–8.1)
Total Bilirubin: 0.6 mg/dL (ref 0.2–1.2)

## 2015-10-27 LAB — LIPID PANEL
CHOL/HDL RATIO: 4.4 ratio (ref <=5.0)
Cholesterol: 299 mg/dL — ABNORMAL HIGH (ref 125–200)
HDL: 68 mg/dL (ref >=46)
LDL CALC: 179 mg/dL — AB (ref <130)
TRIGLYCERIDES: 258 mg/dL — AB (ref <150)
VLDL: 52 mg/dL — ABNORMAL HIGH (ref <30)

## 2015-10-27 LAB — HEMOGLOBIN A1C
HEMOGLOBIN A1C: 8.2 % — AB (ref <5.7)
MEAN PLASMA GLUCOSE: 189 mg/dL

## 2015-10-27 NOTE — Progress Notes (Signed)
Carly Moore  MRN: [id number] DOB: 1945/09/28  Subjective:  Pt presents to clinic for follow up on diabetes, HTN, and hyperlipidemia. She is tolerating her medication.  She takes a fish oil omega-3 fatty acid supplement, but is not interested in taking anything else for her hyperlipidemia.  She does not check her blood glucose regularly at home but notes that her highest reading in the last few months has been 135 mg/dL. She feels like it has possibly been higher over the last 2 weeks because she had her birthday and also went to a baby shower for her granddaughter where she ate some sweets and "other foods she's not supposed to have". She does have a little tingling in her little toe on her right foot from time to time, but denies any numbness in either foot or in her hands. She checks her feet daily and denies any sores. She does get up to urinate a few times at night, but attributes this more to her age.  She is trying a smoothie recipe she found to help with her diabetes using natural ingredients.   Review of Systems  Constitutional: Negative for chills and fever.  Respiratory: Negative for shortness of breath.   Cardiovascular: Negative for chest pain, palpitations and leg swelling.  Skin: Negative for wound.  Neurological: Negative for numbness.    Patient Active Problem List   Diagnosis Date Noted  . Glaucoma   . HTN (hypertension)   . Diabetes type 2, uncontrolled (HCC)   . Hyperlipidemia     Current Outpatient Prescriptions on File Prior to Visit  Medication Sig Dispense Refill  . Calcium-Magnesium-Vitamin D (CALCIUM MAGNESIUM PO) Take by mouth daily.    Marland Kitchen co-enzyme Q-10 30 MG capsule Take 30 mg by mouth 3 (three) times daily.      . fish oil-omega-3 fatty acids 1000 MG capsule Take 2 g by mouth daily.      Marland Kitchen glucosamine-chondroitin 500-400 MG tablet Take 1 tablet by mouth 3 (three) times daily.      Marland Kitchen latanoprost (XALATAN) 0.005 % ophthalmic solution     . VITAMIN D,  CHOLECALCIFEROL, PO Take by mouth daily.    . dorzolamide-timolol (COSOPT) 22.3-6.8 MG/ML ophthalmic solution      No current facility-administered medications on file prior to visit.     Allergies  Allergen Reactions  . Sulfa Antibiotics Rash    Pt patients past, family and social history were reviewed and updated.  Objective:  BP 128/80 (BP Location: Right Arm, Patient Position: Sitting)   Pulse 86   Temp 98.2 F (36.8 C) (Oral)   Resp 18   Ht 5' (1.524 m)   Wt 165 lb 6.4 oz (75 kg)   SpO2 97%   BMI 32.30 kg/m   Physical Exam  Constitutional: She is oriented to person, place, and time and well-developed, well-nourished, and in no distress.  HENT:  Head: Normocephalic and atraumatic.  Right Ear: External ear normal.  Left Ear: External ear normal.  Eyes: Conjunctivae are normal.  Cardiovascular: Normal rate, regular rhythm and normal heart sounds.   No murmur heard. Pulmonary/Chest: Effort normal and breath sounds normal.  Neurological: She is alert and oriented to person, place, and time. Gait normal.  Skin: Skin is warm and dry.  Diabetic Foot Exam - Simple   Simple Foot Form Diabetic Foot exam was performed with the following findings:  Yes  10/26/2015 11:35 AM  Visual Inspection No deformities, no ulcerations, no other skin breakdown bilaterally:  Yes Sensation Testing Intact to touch and monofilament testing bilaterally:  Yes Pulse Check Posterior Tibialis and Dorsalis pulse intact bilaterally:  Yes Comments     Psychiatric: Mood, memory, affect and judgment normal.  Vitals reviewed.   Assessment and Plan :  Essential hypertension - Plan: Comprehensive metabolic panel, hydrochlorothiazide (HYDRODIURIL) 25 MG tablet, losartan (COZAAR) 100 MG tablet- well controlled  Uncontrolled type 2 diabetes mellitus without complication, without long-term current use of insulin (HCC) - Plan: Comprehensive metabolic panel, Hemoglobin A1c, metFORMIN (GLUCOPHAGE) 1000 MG  tablet  Hyperlipidemia - Plan: Lipid panel - monitor - pt is not interested in taking any medications for this and will continue her natural remedies.  We will adjust medications based on lab results.  Benny Lennert PA-C  [facility] 10/27/2015 9:30 AM

## 2015-10-31 ENCOUNTER — Encounter: Payer: Self-pay | Admitting: Physician Assistant

## 2015-10-31 DIAGNOSIS — Z0000 Encounter for general adult medical examination without abnormal findings: Secondary | ICD-10-CM | POA: Diagnosis not present

## 2015-10-31 DIAGNOSIS — E1165 Type 2 diabetes mellitus with hyperglycemia: Secondary | ICD-10-CM | POA: Diagnosis not present

## 2015-10-31 DIAGNOSIS — I10 Essential (primary) hypertension: Secondary | ICD-10-CM | POA: Diagnosis not present

## 2016-03-05 ENCOUNTER — Telehealth: Payer: Self-pay

## 2016-03-05 NOTE — Telephone Encounter (Signed)
Recd fax from [facility] - advising pt not refilling Metformin 1000mg .   IC pt - she refilled in the past couple days.

## 2016-03-05 NOTE — Telephone Encounter (Signed)
Can you clarify this message I am not sure I understand.  Is atena not allowing her to refill the medication or are we dening because of the time frame from her last visit - she can have a total of 6 months is that is the case.

## 2016-03-12 NOTE — Telephone Encounter (Signed)
It was a notice saying gap in rx fills. Pt. Got rx this month and is on it

## 2016-03-29 DIAGNOSIS — R404 Transient alteration of awareness: Secondary | ICD-10-CM | POA: Diagnosis not present

## 2016-03-29 DIAGNOSIS — R55 Syncope and collapse: Secondary | ICD-10-CM | POA: Diagnosis not present

## 2016-04-12 DIAGNOSIS — H401131 Primary open-angle glaucoma, bilateral, mild stage: Secondary | ICD-10-CM | POA: Diagnosis not present

## 2016-04-12 DIAGNOSIS — H3533 Angioid streaks of macula: Secondary | ICD-10-CM | POA: Diagnosis not present

## 2016-04-12 DIAGNOSIS — E119 Type 2 diabetes mellitus without complications: Secondary | ICD-10-CM | POA: Diagnosis not present

## 2016-04-19 DIAGNOSIS — E119 Type 2 diabetes mellitus without complications: Secondary | ICD-10-CM | POA: Diagnosis not present

## 2016-05-03 IMAGING — CR DG FINGER MIDDLE 2+V*L*
1 series · 1 of 1 positions shown · non-contrast
Comparison: None.

CLINICAL DATA: Finger pain.  Initial encounter.

EXAM:
LEFT MIDDLE FINGER 2+V

[PA]
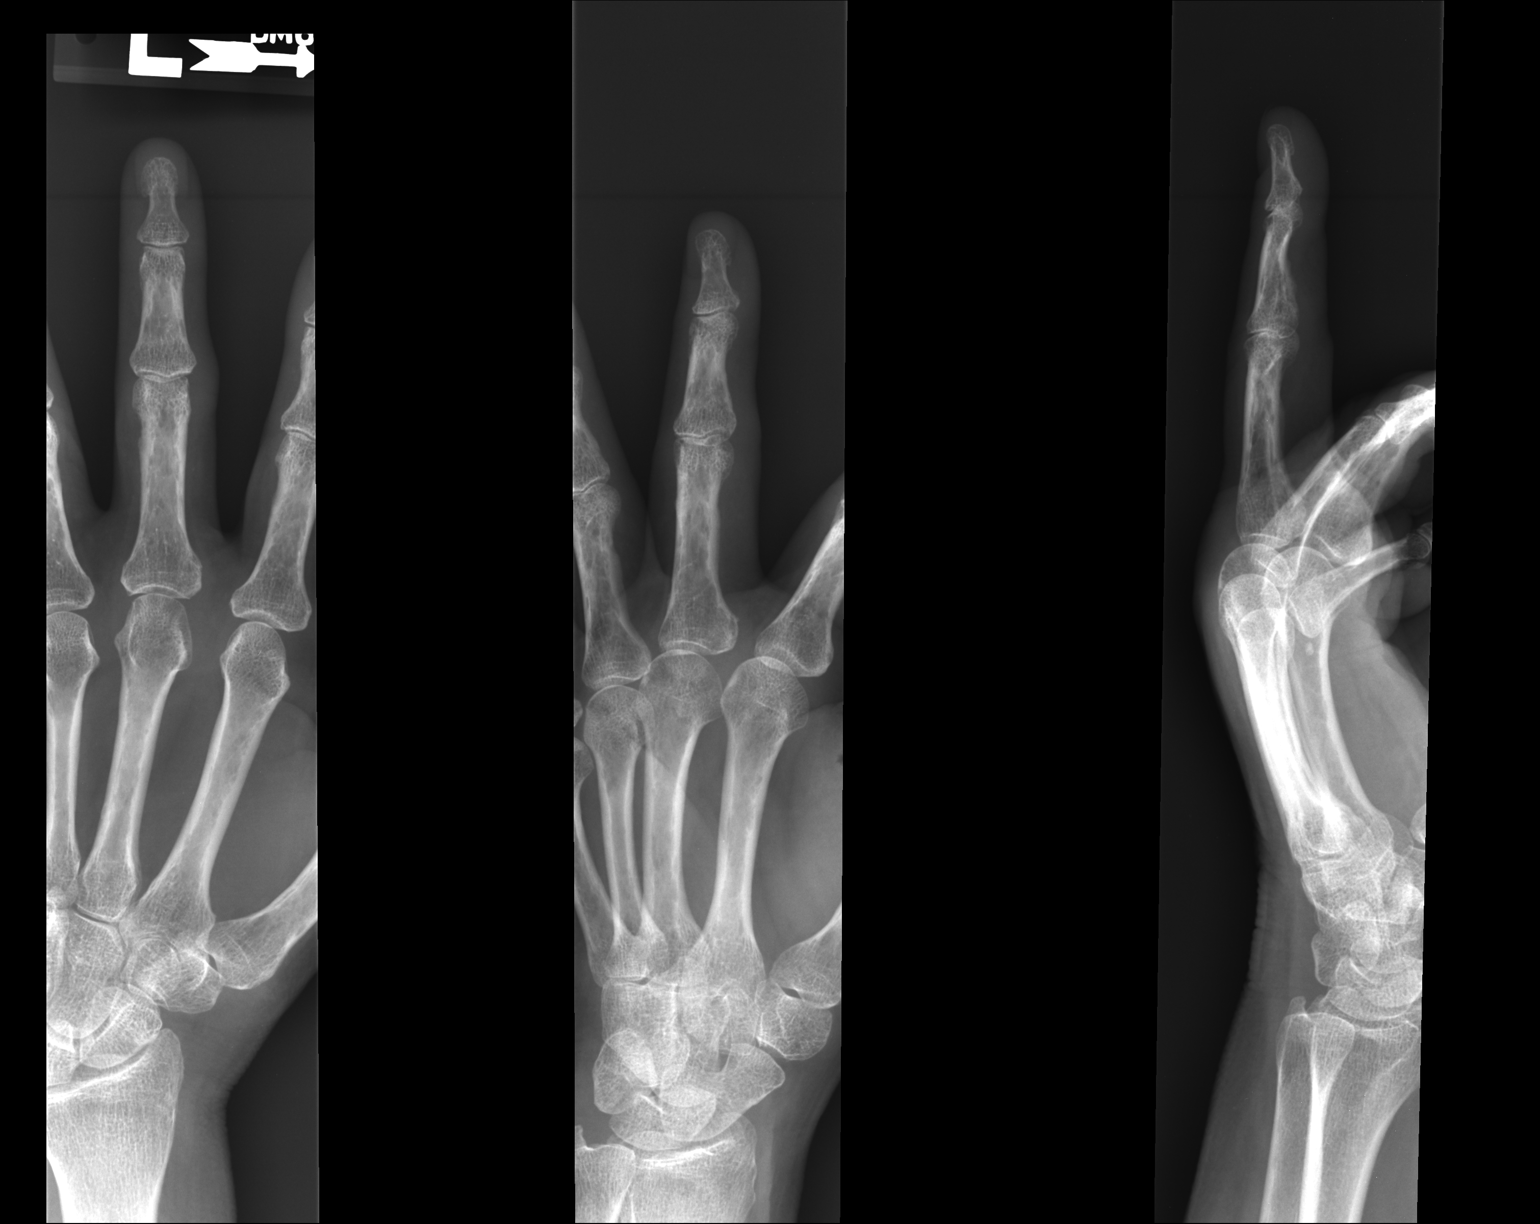

[1 of 1 positions shown; findings below may reference images not displayed]

FINDINGS: The bones appear moderately demineralized. There is mild
interphalangeal and metacarpophalangeal joint space loss. No erosive
changes are identified. There is no evidence of acute fracture,
dislocation or foreign body. There is possible mild soft tissues
swelling at the proximal interphalangeal joint.
IMPRESSION: Osseous demineralization and degenerative changes as described. No
acute findings or evidence of inflammatory arthropathy.

## 2016-05-21 ENCOUNTER — Encounter: Payer: Self-pay | Admitting: Physician Assistant

## 2016-05-21 ENCOUNTER — Ambulatory Visit (INDEPENDENT_AMBULATORY_CARE_PROVIDER_SITE_OTHER): Payer: Medicare HMO | Admitting: Physician Assistant

## 2016-05-21 VITALS — BP 130/80 | HR 102 | Temp 98.4°F | Resp 17 | Ht 60.0 in | Wt 160.0 lb

## 2016-05-21 DIAGNOSIS — I10 Essential (primary) hypertension: Secondary | ICD-10-CM

## 2016-05-21 DIAGNOSIS — IMO0001 Reserved for inherently not codable concepts without codable children: Secondary | ICD-10-CM

## 2016-05-21 DIAGNOSIS — E1165 Type 2 diabetes mellitus with hyperglycemia: Secondary | ICD-10-CM

## 2016-05-21 MED ORDER — HYDROCHLOROTHIAZIDE 25 MG PO TABS: ORAL_TABLET | ORAL | 1 refills | Status: DC

## 2016-05-21 MED ORDER — METFORMIN HCL 1000 MG PO TABS: ORAL_TABLET | ORAL | 1 refills | Status: DC

## 2016-05-21 MED ORDER — LOSARTAN POTASSIUM 100 MG PO TABS: 100.0000 mg | ORAL_TABLET | Freq: Every day | ORAL | 1 refills | Status: DC

## 2016-05-21 NOTE — Patient Instructions (Signed)
     IF you received an x-ray today, you will receive an invoice from [facility]. Please contact [facility] at [phone] with questions or concerns regarding your invoice.   IF you received labwork today, you will receive an invoice from [facility]. Please contact [facility] at [phone] with questions or concerns regarding your invoice.   Our billing staff will not be able to assist you with questions regarding bills from these companies.  You will be contacted with the lab results as soon as they are available. The fastest way to get your results is to activate your My Chart account. Instructions are located on the last page of this paperwork. If you have not heard from us regarding the results in 2 weeks, please contact this office.     

## 2016-05-21 NOTE — Progress Notes (Signed)
   Carly Moore  MRN: [id number] DOB: Dec 23, 1945  Subjective:  Pt presents to clinic for medication refills.  She is feeling well - she knows her sugars are slightly elevated because she has not been eating well.  She plans on starting her smoothies to help control her glucose better.  Not doing well with her diet.    Review of Systems  Constitutional: Negative for chills and fever.  Respiratory: Negative for shortness of breath.   Cardiovascular: Negative for chest pain, palpitations and leg swelling.  Skin: Negative for wound.  Neurological: Negative for numbness.    Patient Active Problem List   Diagnosis Date Noted  . Glaucoma   . HTN (hypertension)   . Diabetes type 2, uncontrolled ([place])   . Hyperlipidemia     Current Outpatient Prescriptions on File Prior to Visit  Medication Sig Dispense Refill  . Calcium-Magnesium-Vitamin D (CALCIUM MAGNESIUM PO) Take by mouth daily.    Marland Kitchen co-enzyme Q-10 30 MG capsule Take 30 mg by mouth 3 (three) times daily.      . fish oil-omega-3 fatty acids 1000 MG capsule Take 2 g by mouth daily.      Marland Kitchen glucosamine-chondroitin 500-400 MG tablet Take 1 tablet by mouth 3 (three) times daily.      Marland Kitchen latanoprost (XALATAN) 0.005 % ophthalmic solution     . VITAMIN D, CHOLECALCIFEROL, PO Take by mouth daily.     No current facility-administered medications on file prior to visit.     Allergies  Allergen Reactions  . Sulfa Antibiotics Rash    Pt patients past, family and social history were reviewed and updated.   Objective:  BP 130/80 (BP Location: Right Arm, Patient Position: Sitting, Cuff Size: Normal)   Pulse (!) 102   Temp 98.4 F (36.9 C) (Oral)   Resp 17   Ht 5' (1.524 m)   Wt 160 lb (72.6 kg)   SpO2 97%   BMI 31.25 kg/m   Physical Exam  Constitutional: She is oriented to person, place, and time and well-developed, well-nourished, and in no distress.  HENT:  Head: Normocephalic and atraumatic.  Right Ear: External ear normal.    Left Ear: External ear normal.  Eyes: Conjunctivae are normal.  Cardiovascular: Normal rate, regular rhythm and normal heart sounds.   No murmur heard. Pulmonary/Chest: Effort normal and breath sounds normal.  Neurological: She is alert and oriented to person, place, and time. Gait normal.  Skin: Skin is warm and dry.  Psychiatric: Mood, memory, affect and judgment normal.  Vitals reviewed.   Assessment and Plan :  Uncontrolled type 2 diabetes mellitus without complication, without long-term current use of insulin (HCC) - Plan: Hemoglobin A1c, metFORMIN (GLUCOPHAGE) 1000 MG tablet  Essential hypertension - Plan: CMP14+EGFR, losartan (COZAAR) 100 MG tablet, hydrochlorothiazide (HYDRODIURIL) 25 MG tablet   Well controlled HTN wait for labwork for her DM.  Pt will likely not want a change in her DM regimen as she likes to do diet control as best she can.  She does not care for aggressive medical intervention she prefers to do natural treatment and diet.  Windell Hummingbird PA-C  [facility] 05/21/2016 12:11 PM

## 2016-05-22 LAB — CMP14+EGFR
A/G RATIO: 1.6 (ref 1.2–2.2)
ALT: 30 IU/L (ref 0–32)
AST: 24 IU/L (ref 0–40)
Albumin: 4.3 g/dL (ref 3.5–4.8)
Alkaline Phosphatase: 85 IU/L (ref 39–117)
BUN / CREAT RATIO: 12 (ref 12–28)
BUN: 12 mg/dL (ref 8–27)
Bilirubin Total: 0.4 mg/dL (ref 0.0–1.2)
CALCIUM: 9.6 mg/dL (ref 8.7–10.3)
CO2: 22 mmol/L (ref 18–29)
Chloride: 98 mmol/L (ref 96–106)
Creatinine, Ser: 0.98 mg/dL (ref 0.57–1.00)
GFR, EST AFRICAN AMERICAN: 67 mL/min/{1.73_m2} (ref >59)
GFR, EST NON AFRICAN AMERICAN: 58 mL/min/{1.73_m2} — AB (ref >59)
GLOBULIN, TOTAL: 2.7 g/dL (ref 1.5–4.5)
Glucose: 203 mg/dL — ABNORMAL HIGH (ref 65–99)
POTASSIUM: 3.8 mmol/L (ref 3.5–5.2)
SODIUM: 141 mmol/L (ref 134–144)
TOTAL PROTEIN: 7 g/dL (ref 6.0–8.5)

## 2016-05-22 LAB — HEMOGLOBIN A1C
Est. average glucose Bld gHb Est-mCnc: 197 mg/dL
Hgb A1c MFr Bld: 8.5 % — ABNORMAL HIGH (ref 4.8–5.6)

## 2016-07-20 DIAGNOSIS — E119 Type 2 diabetes mellitus without complications: Secondary | ICD-10-CM | POA: Diagnosis not present

## 2016-08-20 ENCOUNTER — Telehealth: Payer: Self-pay | Admitting: Family Medicine

## 2016-08-20 NOTE — Telephone Encounter (Signed)
Pt need a refill on losartan until her appt with Maralyn SagoSarah in June

## 2016-09-10 ENCOUNTER — Ambulatory Visit (INDEPENDENT_AMBULATORY_CARE_PROVIDER_SITE_OTHER): Payer: Medicare HMO | Admitting: Physician Assistant

## 2016-09-10 ENCOUNTER — Encounter: Payer: Self-pay | Admitting: Physician Assistant

## 2016-09-10 VITALS — BP 150/82 | HR 93 | Temp 98.1°F | Resp 18 | Ht 60.0 in | Wt 159.2 lb

## 2016-09-10 DIAGNOSIS — I10 Essential (primary) hypertension: Secondary | ICD-10-CM | POA: Diagnosis not present

## 2016-09-10 DIAGNOSIS — L989 Disorder of the skin and subcutaneous tissue, unspecified: Secondary | ICD-10-CM

## 2016-09-10 DIAGNOSIS — E1165 Type 2 diabetes mellitus with hyperglycemia: Secondary | ICD-10-CM | POA: Diagnosis not present

## 2016-09-10 DIAGNOSIS — IMO0001 Reserved for inherently not codable concepts without codable children: Secondary | ICD-10-CM

## 2016-09-10 MED ORDER — CHLORTHALIDONE 50 MG PO TABS: 50.0000 mg | ORAL_TABLET | Freq: Every day | ORAL | 1 refills | Status: DC

## 2016-09-10 MED ORDER — METFORMIN HCL 1000 MG PO TABS: ORAL_TABLET | ORAL | 3 refills | Status: DC

## 2016-09-10 MED ORDER — LOSARTAN POTASSIUM 100 MG PO TABS: 100.0000 mg | ORAL_TABLET | Freq: Every day | ORAL | 3 refills | Status: DC

## 2016-09-10 NOTE — Progress Notes (Signed)
Carly Moore  MRN: [id number] DOB: 11/03/1945  PCP: Mancel Bale, PA-C  Chief Complaint  Patient presents with  . Hypertension    29monthfollow up  . Diabetes    652monthollow up    Subjective:  Pt presents to clinic for medication refill.  She has been doing well.  She is not checking her BP or blood sugar at home.  She takes her medication in the afternoon.  She is tolerating the medications ok.  She is not interested in preventive care for the most part.  She has not been eating well.  She has a lesion on her left upper chest that has not changed but she would like to know what it is.  It does not bother her.  Review of Systems  Constitutional: Negative for chills and fever.  Respiratory: Negative for shortness of breath.   Cardiovascular: Negative for chest pain, palpitations and leg swelling.  Skin: Negative for wound.  Neurological: Negative for numbness and headaches.    Patient Active Problem List   Diagnosis Date Noted  . Glaucoma   . HTN (hypertension)   . [place]  . Hyperlipidemia     Current Outpatient Prescriptions on File Prior to Visit  Medication Sig Dispense Refill  . Calcium-Magnesium-Vitamin D (CALCIUM MAGNESIUM PO) Take by mouth daily.    . Marland Kitcheno-enzyme Q-10 30 MG capsule Take 30 mg by mouth 3 (three) times daily.      . fish oil-omega-3 fatty acids 1000 MG capsule Take 2 g by mouth daily.      . Marland Kitchenlucosamine-chondroitin 500-400 MG tablet Take 1 tablet by mouth 3 (three) times daily.      . Marland Kitchenatanoprost (XALATAN) 0.005 % ophthalmic solution     . VITAMIN D, CHOLECALCIFEROL, PO Take by mouth daily.     No current facility-administered medications on file prior to visit.     Allergies  Allergen Reactions  . Sulfa Antibiotics Rash    Pt patients past, family and social history were reviewed and updated.   Objective:  BP (!) 150/82   Pulse 93   Temp 98.1 F (36.7 C) (Oral)   Resp 18   Ht 5' (1.524 m)   Wt 159 lb  3.2 oz (72.2 kg)   SpO2 97%   BMI 31.09 kg/m   Physical Exam  Constitutional: She is oriented to person, place, and time and well-developed, well-nourished, and in no distress.  HENT:  Head: Normocephalic and atraumatic.  Right Ear: Hearing and external ear normal.  Left Ear: Hearing and external ear normal.  Eyes: Conjunctivae are normal.  Neck: Normal range of motion.  Cardiovascular: Normal rate, regular rhythm and normal heart sounds.   No murmur heard. Pulmonary/Chest: Effort normal and breath sounds normal.  Musculoskeletal:       Right lower leg: She exhibits no edema.       Left lower leg: She exhibits no edema.  Neurological: She is alert and oriented to person, place, and time. Gait normal.  Skin: Skin is warm and dry.  75m84mlesh colored raised lesion - consistent with a flesh colored nevi  Psychiatric: Mood, memory, affect and judgment normal.  Vitals reviewed.   Assessment and Plan :  Essential hypertension - Plan: CMP14+EGFR, chlorthalidone (HYGROTON) 50 MG tablet, losartan (COZAAR) 100 MG tablet  Uncontrolled type 2 diabetes mellitus without complication, without long-term current use of insulin (HCC) - Plan: Hemoglobin A1c, metFORMIN (GLUCOPHAGE) 1000 MG tablet, Microalbumin, urine  Skin lesion - flesh colored mole - watch and wait  Check labs - likely no medication titration will need to be done so medications refilled.  Did switch from HCTZ due to short acting to chlorthalidone to see if she gets longer coverage for her BP. Recheck in 6 months.  Pt is not sure if she will get her mammogram this year but she was encouraged to call and make the appt.  Windell Hummingbird PA-C  [facility] 09/10/2016 2:40 PM

## 2016-09-10 NOTE — Patient Instructions (Signed)
     IF you received an x-ray today, you will receive an invoice from [facility]. Please contact [facility] at [phone] with questions or concerns regarding your invoice.   IF you received labwork today, you will receive an invoice from [facility]. Please contact [facility] at [phone] with questions or concerns regarding your invoice.   Our billing staff will not be able to assist you with questions regarding bills from these companies.  You will be contacted with the lab results as soon as they are available. The fastest way to get your results is to activate your My Chart account. Instructions are located on the last page of this paperwork. If you have not heard from us regarding the results in 2 weeks, please contact this office.     

## 2016-09-11 LAB — CMP14+EGFR
A/G RATIO: 1.6 (ref 1.2–2.2)
ALBUMIN: 4.4 g/dL (ref 3.5–4.8)
ALK PHOS: 92 IU/L (ref 39–117)
ALT: 27 IU/L (ref 0–32)
AST: 21 IU/L (ref 0–40)
BILIRUBIN TOTAL: 0.4 mg/dL (ref 0.0–1.2)
BUN / CREAT RATIO: 12 (ref 12–28)
BUN: 12 mg/dL (ref 8–27)
CO2: 23 mmol/L (ref 20–29)
CREATININE: 1.04 mg/dL — AB (ref 0.57–1.00)
Calcium: 9.6 mg/dL (ref 8.7–10.3)
Chloride: 101 mmol/L (ref 96–106)
GFR calc Af Amer: 62 mL/min/{1.73_m2} (ref >59)
GFR calc non Af Amer: 54 mL/min/{1.73_m2} — ABNORMAL LOW (ref >59)
GLOBULIN, TOTAL: 2.7 g/dL (ref 1.5–4.5)
Glucose: 198 mg/dL — ABNORMAL HIGH (ref 65–99)
POTASSIUM: 4.1 mmol/L (ref 3.5–5.2)
SODIUM: 142 mmol/L (ref 134–144)
Total Protein: 7.1 g/dL (ref 6.0–8.5)

## 2016-09-11 LAB — MICROALBUMIN, URINE: MICROALBUM., U, RANDOM: 123.7 ug/mL

## 2016-09-12 ENCOUNTER — Encounter: Payer: Self-pay | Admitting: Physician Assistant

## 2016-09-16 ENCOUNTER — Encounter: Payer: Self-pay | Admitting: Physician Assistant

## 2016-09-16 LAB — POCT GLYCOSYLATED HEMOGLOBIN (HGB A1C): HEMOGLOBIN A1C: 8.2

## 2016-09-16 NOTE — Addendum Note (Signed)
Addended by: Morrell RiddleWEBER, Kamalani Mastro L on: 09/16/2016 10:03 AM   Modules accepted: Orders

## 2016-09-20 NOTE — Progress Notes (Signed)
Order(s) created erroneously. Erroneous order ID: [id number] ° Order moved by: RYDING, CHERYL A ° Order move date/time: 09/20/2016 2:46 PM ° Source Patient: [id number] ° Source Contact: 09/10/2016 ° Destination Patient: [id number] ° Destination Contact: 06/09/2012 °

## 2016-10-19 DIAGNOSIS — E119 Type 2 diabetes mellitus without complications: Secondary | ICD-10-CM | POA: Diagnosis not present

## 2016-11-01 ENCOUNTER — Telehealth: Payer: Self-pay

## 2016-11-01 NOTE — Telephone Encounter (Signed)
Called pt to schedule Medicare Annual Wellness Visit with Nurse Health Advisor. -nr 

## 2017-01-18 DIAGNOSIS — E119 Type 2 diabetes mellitus without complications: Secondary | ICD-10-CM | POA: Diagnosis not present

## 2017-03-04 ENCOUNTER — Ambulatory Visit: Payer: Medicare HMO | Admitting: Physician Assistant

## 2017-03-06 ENCOUNTER — Other Ambulatory Visit: Payer: Self-pay | Admitting: Physician Assistant

## 2017-03-06 DIAGNOSIS — I10 Essential (primary) hypertension: Secondary | ICD-10-CM

## 2017-04-01 ENCOUNTER — Ambulatory Visit: Payer: Medicare HMO | Admitting: Physician Assistant

## 2017-04-08 ENCOUNTER — Ambulatory Visit: Payer: Medicare HMO | Admitting: Physician Assistant

## 2017-04-08 ENCOUNTER — Ambulatory Visit: Payer: Medicare HMO

## 2017-04-11 ENCOUNTER — Ambulatory Visit (INDEPENDENT_AMBULATORY_CARE_PROVIDER_SITE_OTHER): Payer: Medicare HMO | Admitting: Physician Assistant

## 2017-04-11 ENCOUNTER — Other Ambulatory Visit: Payer: Self-pay

## 2017-04-11 ENCOUNTER — Encounter: Payer: Self-pay | Admitting: Physician Assistant

## 2017-04-11 VITALS — BP 134/64 | HR 111 | Temp 98.0°F | Resp 18 | Ht 60.0 in | Wt 156.8 lb

## 2017-04-11 DIAGNOSIS — E7800 Pure hypercholesterolemia, unspecified: Secondary | ICD-10-CM

## 2017-04-11 DIAGNOSIS — E1165 Type 2 diabetes mellitus with hyperglycemia: Secondary | ICD-10-CM | POA: Diagnosis not present

## 2017-04-11 DIAGNOSIS — I10 Essential (primary) hypertension: Secondary | ICD-10-CM | POA: Diagnosis not present

## 2017-04-11 MED ORDER — METFORMIN HCL 1000 MG PO TABS: ORAL_TABLET | ORAL | 3 refills | Status: DC

## 2017-04-11 MED ORDER — LOSARTAN POTASSIUM 100 MG PO TABS: 100.0000 mg | ORAL_TABLET | Freq: Every day | ORAL | 3 refills | Status: DC

## 2017-04-11 MED ORDER — CHLORTHALIDONE 50 MG PO TABS: 50.0000 mg | ORAL_TABLET | Freq: Every day | ORAL | 3 refills | Status: DC

## 2017-04-11 NOTE — Progress Notes (Signed)
Carly Moore  MRN: [id number] DOB: 03/04/1946  PCP: Mancel Bale, PA-C  Chief Complaint  Patient presents with  . Hypertension    follow up    Subjective:  Pt presents to clinic for her 6 month f/u.  She has noticed she has had a decrease in her appetite and therefore she has had some weight loss.  She feels good.  She thinks she has eaten more sweets than normal due to the holidays.  She eats fruits daily.  History is obtained by patient.  Review of Systems  Constitutional: Negative for chills and fever.  Eyes: Negative for visual disturbance.  Respiratory: Negative for shortness of breath.   Cardiovascular: Negative for chest pain, palpitations and leg swelling.  Skin: Negative for wound.  Neurological: Negative for dizziness, light-headedness, numbness and headaches.    Patient Active Problem List   Diagnosis Date Noted  . Glaucoma   . HTN (hypertension)   . Diabetes type 2, uncontrolled ([place])   . Hyperlipidemia     Current Outpatient Medications on File Prior to Visit  Medication Sig Dispense Refill  . Calcium-Magnesium-Vitamin D (CALCIUM MAGNESIUM PO) Take by mouth daily.    Marland Kitchen co-enzyme Q-10 30 MG capsule Take 30 mg by mouth 3 (three) times daily.      . fish oil-omega-3 fatty acids 1000 MG capsule Take 2 g by mouth daily.      Marland Kitchen glucosamine-chondroitin 500-400 MG tablet Take 1 tablet by mouth 3 (three) times daily.      Marland Kitchen latanoprost (XALATAN) 0.005 % ophthalmic solution     . VITAMIN D, CHOLECALCIFEROL, PO Take by mouth daily.     No current facility-administered medications on file prior to visit.     Allergies  Allergen Reactions  . Sulfa Antibiotics Rash    Past Medical History:  Diagnosis Date  . Diabetes type 2, controlled ([place])   . Glaucoma   . HTN (hypertension)   . Hyperlipidemia    Social History   Social History Narrative   Single   1 son   Social History   Tobacco Use  . Smoking status: Never Smoker  . Smokeless tobacco:  Never Used  Substance Use Topics  . Alcohol use: Yes  . Drug use: No   family history includes Alcohol abuse in her brother; Cancer in her father; Diabetes in her father; Heart disease in her mother.     Objective:  BP 134/64   Pulse (!) 111   Temp 98 F (36.7 C) (Oral)   Resp 18   Ht 5' (1.524 m)   Wt 156 lb 12.8 oz (71.1 kg)   SpO2 97%   BMI 30.62 kg/m  Body mass index is 30.62 kg/m.  Physical Exam  Constitutional: She is oriented to person, place, and time and well-developed, well-nourished, and in no distress.  HENT:  Head: Normocephalic and atraumatic.  Right Ear: Hearing and external ear normal.  Left Ear: Hearing and external ear normal.  Eyes: Conjunctivae are normal.  Neck: Normal range of motion.  Cardiovascular: Normal rate, regular rhythm and normal heart sounds.  No murmur heard. Pulmonary/Chest: Effort normal and breath sounds normal.  Musculoskeletal:       Right lower leg: She exhibits no edema.       Left lower leg: She exhibits no edema.  Neurological: She is alert and oriented to person, place, and time. Gait normal.  Skin: Skin is warm and dry.  Psychiatric: Mood, memory, affect and judgment normal.  Vitals reviewed.   Assessment and Plan :  Essential hypertension - Plan: CMP14+EGFR, chlorthalidone (HYGROTON) 50 MG tablet, losartan (COZAAR) 100 MG tablet - well controlled - continue medications  Uncontrolled type 2 diabetes mellitus with hyperglycemia (HCC) - Plan: Hemoglobin A1c, metFORMIN (GLUCOPHAGE) 1000 MG tablet, HM DIABETES FOOT EXAM - adjust meds based on lab results.  Encouraged pt to be mindful of her sweets.  Pure hypercholesterolemia - Plan: CMP14+EGFR, Lipid panel - check labs - she is not interested in Statins for treatment   Windell Hummingbird PA-C  [facility] [place] 04/11/2017 11:26 AM

## 2017-04-11 NOTE — Patient Instructions (Addendum)
I will see you in 6 months -     IF you received an x-ray today, you will receive an invoice from [facility]. Please contact [facility] at [phone] with questions or concerns regarding your invoice.   IF you received labwork today, you will receive an invoice from [facility]. Please contact [facility] at [phone] with questions or concerns regarding your invoice.   Our billing staff will not be able to assist you with questions regarding bills from these companies.  You will be contacted with the lab results as soon as they are available. The fastest way to get your results is to activate your My Chart account. Instructions are located on the last page of this paperwork. If you have not heard from us regarding the results in 2 weeks, please contact this office.

## 2017-04-12 LAB — CMP14+EGFR
A/G RATIO: 1.4 (ref 1.2–2.2)
ALT: 44 IU/L — ABNORMAL HIGH (ref 0–32)
AST: 45 IU/L — AB (ref 0–40)
Albumin: 4.3 g/dL (ref 3.5–4.8)
Alkaline Phosphatase: 95 IU/L (ref 39–117)
BUN/Creatinine Ratio: 17 (ref 12–28)
BUN: 20 mg/dL (ref 8–27)
Bilirubin Total: 0.5 mg/dL (ref 0.0–1.2)
CALCIUM: 9.8 mg/dL (ref 8.7–10.3)
CO2: 20 mmol/L (ref 20–29)
Chloride: 95 mmol/L — ABNORMAL LOW (ref 96–106)
Creatinine, Ser: 1.2 mg/dL — ABNORMAL HIGH (ref 0.57–1.00)
GFR calc non Af Amer: 45 mL/min/{1.73_m2} — ABNORMAL LOW (ref >59)
GFR, EST AFRICAN AMERICAN: 52 mL/min/{1.73_m2} — AB (ref >59)
GLOBULIN, TOTAL: 3 g/dL (ref 1.5–4.5)
Glucose: 297 mg/dL — ABNORMAL HIGH (ref 65–99)
POTASSIUM: 3.9 mmol/L (ref 3.5–5.2)
SODIUM: 136 mmol/L (ref 134–144)
TOTAL PROTEIN: 7.3 g/dL (ref 6.0–8.5)

## 2017-04-12 LAB — LIPID PANEL
Chol/HDL Ratio: 5.3 ratio — ABNORMAL HIGH (ref 0.0–4.4)
Cholesterol, Total: 278 mg/dL — ABNORMAL HIGH (ref 100–199)
HDL: 52 mg/dL (ref >39)
LDL Calculated: 175 mg/dL — ABNORMAL HIGH (ref 0–99)
Triglycerides: 256 mg/dL — ABNORMAL HIGH (ref 0–149)
VLDL Cholesterol Cal: 51 mg/dL — ABNORMAL HIGH (ref 5–40)

## 2017-04-12 LAB — HEMOGLOBIN A1C
Est. average glucose Bld gHb Est-mCnc: 255 mg/dL
HEMOGLOBIN A1C: 10.5 % — AB (ref 4.8–5.6)

## 2017-04-19 DIAGNOSIS — E119 Type 2 diabetes mellitus without complications: Secondary | ICD-10-CM | POA: Diagnosis not present

## 2017-04-29 DIAGNOSIS — H35371 Puckering of macula, right eye: Secondary | ICD-10-CM | POA: Diagnosis not present

## 2017-04-29 DIAGNOSIS — H04123 Dry eye syndrome of bilateral lacrimal glands: Secondary | ICD-10-CM | POA: Diagnosis not present

## 2017-04-29 DIAGNOSIS — E119 Type 2 diabetes mellitus without complications: Secondary | ICD-10-CM | POA: Diagnosis not present

## 2017-04-29 DIAGNOSIS — H401131 Primary open-angle glaucoma, bilateral, mild stage: Secondary | ICD-10-CM | POA: Diagnosis not present

## 2017-06-04 ENCOUNTER — Other Ambulatory Visit: Payer: Self-pay | Admitting: Physician Assistant

## 2017-06-04 DIAGNOSIS — I10 Essential (primary) hypertension: Secondary | ICD-10-CM

## 2017-07-19 DIAGNOSIS — E119 Type 2 diabetes mellitus without complications: Secondary | ICD-10-CM | POA: Diagnosis not present

## 2017-08-30 ENCOUNTER — Other Ambulatory Visit: Payer: Self-pay | Admitting: Physician Assistant

## 2017-08-30 DIAGNOSIS — I10 Essential (primary) hypertension: Secondary | ICD-10-CM

## 2017-09-03 ENCOUNTER — Other Ambulatory Visit: Payer: Self-pay | Admitting: Physician Assistant

## 2017-09-03 DIAGNOSIS — IMO0001 Reserved for inherently not codable concepts without codable children: Secondary | ICD-10-CM

## 2017-09-03 DIAGNOSIS — E1165 Type 2 diabetes mellitus with hyperglycemia: Principal | ICD-10-CM

## 2017-10-14 ENCOUNTER — Ambulatory Visit: Payer: Medicare HMO | Admitting: Physician Assistant

## 2017-10-18 DIAGNOSIS — E119 Type 2 diabetes mellitus without complications: Secondary | ICD-10-CM | POA: Diagnosis not present

## 2017-11-06 ENCOUNTER — Ambulatory Visit: Payer: Medicare HMO | Admitting: Physician Assistant

## 2017-11-13 ENCOUNTER — Other Ambulatory Visit: Payer: Self-pay | Admitting: Physician Assistant

## 2017-11-13 DIAGNOSIS — I10 Essential (primary) hypertension: Secondary | ICD-10-CM

## 2017-11-13 NOTE — Telephone Encounter (Signed)
Verified with RX refill request was received and being processed presently.

## 2017-11-20 ENCOUNTER — Ambulatory Visit (INDEPENDENT_AMBULATORY_CARE_PROVIDER_SITE_OTHER): Payer: Medicare HMO | Admitting: Physician Assistant

## 2017-11-20 ENCOUNTER — Other Ambulatory Visit: Payer: Self-pay

## 2017-11-20 ENCOUNTER — Encounter: Payer: Self-pay | Admitting: Physician Assistant

## 2017-11-20 VITALS — BP 118/80 | HR 88 | Temp 98.4°F | Resp 18 | Ht 60.0 in | Wt 156.4 lb

## 2017-11-20 DIAGNOSIS — E559 Vitamin D deficiency, unspecified: Secondary | ICD-10-CM | POA: Diagnosis not present

## 2017-11-20 DIAGNOSIS — I10 Essential (primary) hypertension: Secondary | ICD-10-CM

## 2017-11-20 DIAGNOSIS — E1165 Type 2 diabetes mellitus with hyperglycemia: Secondary | ICD-10-CM

## 2017-11-20 DIAGNOSIS — Z780 Asymptomatic menopausal state: Secondary | ICD-10-CM | POA: Diagnosis not present

## 2017-11-20 MED ORDER — LOSARTAN POTASSIUM 100 MG PO TABS: 100.0000 mg | ORAL_TABLET | Freq: Every day | ORAL | 3 refills | Status: DC

## 2017-11-20 MED ORDER — CHLORTHALIDONE 50 MG PO TABS: 50.0000 mg | ORAL_TABLET | Freq: Every day | ORAL | 3 refills | Status: DC

## 2017-11-20 MED ORDER — METFORMIN HCL 1000 MG PO TABS: ORAL_TABLET | ORAL | 3 refills | Status: DC

## 2017-11-20 NOTE — Patient Instructions (Addendum)
   [facility] - [address], [place], [place] [address] Phone: [phone]   If you have lab work done today you will be contacted with your lab results within the next 2 weeks.  If you have not heard from us then please contact us. The fastest way to get your results is to register for My Chart.   IF you received an x-ray today, you will receive an invoice from [facility]. Please contact [facility] at [phone] with questions or concerns regarding your invoice.   IF you received labwork today, you will receive an invoice from [facility]. Please contact [facility] at [phone] with questions or concerns regarding your invoice.   Our billing staff will not be able to assist you with questions regarding bills from these companies.  You will be contacted with the lab results as soon as they are available. The fastest way to get your results is to activate your My Chart account. Instructions are located on the last page of this paperwork. If you have not heard from us regarding the results in 2 weeks, please contact this office.     

## 2017-11-20 NOTE — Progress Notes (Signed)
Carly Moore  MRN: [id number] DOB: Jan 22, 1946  PCP: Mancel Bale, PA-C  Chief Complaint  Patient presents with  . Diabetes    follow up     Subjective:  Pt presents to clinic for DM and HTN recheck and medication refill.  She has been doing really good.  Traveling a lot over the summer and staying active.  She is having no problems with her medications.  History is obtained by patient.  Review of Systems  Constitutional: Negative for chills and fever.  Eyes: Negative for visual disturbance.  Respiratory: Negative for shortness of breath.   Cardiovascular: Negative for chest pain, palpitations and leg swelling.  Skin: Negative for wound.  Neurological: Negative for dizziness, light-headedness, numbness and headaches.    Patient Active Problem List   Diagnosis Date Noted  . Glaucoma   . HTN (hypertension)   . Diabetes type 2, uncontrolled ([place])   . Hyperlipidemia     Current Outpatient Medications on File Prior to Visit  Medication Sig Dispense Refill  . Calcium-Magnesium-Vitamin D (CALCIUM MAGNESIUM PO) Take by mouth daily.    Marland Kitchen co-enzyme Q-10 30 MG capsule Take 30 mg by mouth 3 (three) times daily.      . fish oil-omega-3 fatty acids 1000 MG capsule Take 2 g by mouth daily.      Marland Kitchen glucosamine-chondroitin 500-400 MG tablet Take 1 tablet by mouth 3 (three) times daily.      Marland Kitchen latanoprost (XALATAN) 0.005 % ophthalmic solution     . VITAMIN D, CHOLECALCIFEROL, PO Take by mouth daily.     No current facility-administered medications on file prior to visit.     Allergies  Allergen Reactions  . Sulfa Antibiotics Rash    Past Medical History:  Diagnosis Date  . Diabetes type 2, controlled ([place])   . Glaucoma   . HTN (hypertension)   . Hyperlipidemia    Social History   Social History Narrative   Single   1 son   Social History   Tobacco Use  . Smoking status: Never Smoker  . Smokeless tobacco: Never Used  Substance Use Topics  . Alcohol use: Yes  .  Drug use: No   family history includes Alcohol abuse in her brother; Cancer in her father; Diabetes in her father; Heart disease in her mother.     Objective:  BP 118/80   Pulse 88   Temp 98.4 F (36.9 C) (Oral)   Resp 18   Ht 5' (1.524 m)   Wt 156 lb 6.4 oz (70.9 kg)   SpO2 97%   BMI 30.54 kg/m  Body mass index is 30.54 kg/m.  Wt Readings from Last 3 Encounters:  11/20/17 156 lb 6.4 oz (70.9 kg)  04/11/17 156 lb 12.8 oz (71.1 kg)  09/10/16 159 lb 3.2 oz (72.2 kg)    Physical Exam  Constitutional: She is oriented to person, place, and time. She appears well-developed and well-nourished.  HENT:  Head: Normocephalic and atraumatic.  Right Ear: Hearing and external ear normal.  Left Ear: Hearing and external ear normal.  Eyes: Conjunctivae are normal.  Neck: Normal range of motion.  Cardiovascular: Normal rate, regular rhythm and normal heart sounds.  No murmur heard. Pulmonary/Chest: Effort normal and breath sounds normal.  Musculoskeletal:       Right lower leg: She exhibits no edema.       Left lower leg: She exhibits no edema.  Neurological: She is alert and oriented to person, place, and time.  Skin: Skin is warm and dry.  Psychiatric: She has a normal mood and affect. Her behavior is normal. Judgment and thought content normal.  Vitals reviewed.   Assessment and Plan :  Post-menopausal - Plan: VITAMIN D 25 Hydroxy (Vit-D Deficiency, Fractures)  Essential hypertension - Plan: CMP14+EGFR, chlorthalidone (HYGROTON) 50 MG tablet, losartan (COZAAR) 100 MG tablet  Uncontrolled type 2 diabetes mellitus with hyperglycemia (HCC) - Plan: Hemoglobin A1c, metFORMIN (GLUCOPHAGE) 1000 MG tablet   Continue medications.  Pt declines many preventative services.  She will get her eye report sent to me.  Patient verbalized to me that they understand the following: diagnosis, what is being done for them, what to expect and what should be done at home.  Their questions have been  answered.  See after visit summary for patient specific instructions.  Windell Hummingbird PA-C  [facility] 11/20/2017 11:38 AM  Please note: Portions of this report may have been transcribed using dragon voice recognition software. Every effort was made to ensure accuracy; however, inadvertent computerized transcription errors may be present.

## 2017-11-21 LAB — CMP14+EGFR
ALBUMIN: 4 g/dL (ref 3.5–4.8)
ALT: 32 IU/L (ref 0–32)
AST: 30 IU/L (ref 0–40)
Albumin/Globulin Ratio: 1.4 (ref 1.2–2.2)
Alkaline Phosphatase: 97 IU/L (ref 39–117)
BUN / CREAT RATIO: 11 — AB (ref 12–28)
BUN: 10 mg/dL (ref 8–27)
Bilirubin Total: 0.4 mg/dL (ref 0.0–1.2)
CO2: 21 mmol/L (ref 20–29)
CREATININE: 0.94 mg/dL (ref 0.57–1.00)
Calcium: 9.5 mg/dL (ref 8.7–10.3)
Chloride: 101 mmol/L (ref 96–106)
GFR calc non Af Amer: 60 mL/min/{1.73_m2} (ref >59)
GFR, EST AFRICAN AMERICAN: 70 mL/min/{1.73_m2} (ref >59)
GLUCOSE: 248 mg/dL — AB (ref 65–99)
Globulin, Total: 2.9 g/dL (ref 1.5–4.5)
Potassium: 4.4 mmol/L (ref 3.5–5.2)
Sodium: 139 mmol/L (ref 134–144)
TOTAL PROTEIN: 6.9 g/dL (ref 6.0–8.5)

## 2017-11-21 LAB — HEMOGLOBIN A1C
Est. average glucose Bld gHb Est-mCnc: 217 mg/dL
Hgb A1c MFr Bld: 9.2 % — ABNORMAL HIGH (ref 4.8–5.6)

## 2017-11-21 LAB — VITAMIN D 25 HYDROXY (VIT D DEFICIENCY, FRACTURES): Vit D, 25-Hydroxy: 24.9 ng/mL — ABNORMAL LOW (ref 30.0–100.0)

## 2017-11-22 ENCOUNTER — Encounter: Payer: Self-pay | Admitting: Radiology

## 2017-12-05 DIAGNOSIS — H401131 Primary open-angle glaucoma, bilateral, mild stage: Secondary | ICD-10-CM | POA: Diagnosis not present

## 2017-12-09 ENCOUNTER — Other Ambulatory Visit: Payer: Self-pay | Admitting: Physician Assistant

## 2017-12-09 DIAGNOSIS — I10 Essential (primary) hypertension: Secondary | ICD-10-CM

## 2017-12-09 NOTE — Telephone Encounter (Signed)
Called and spoke with pt who states she [facility] and does not use [facility] any [facility]. Pt notified that [facility] will be [facility] from profile. Pt confirmed that she only wants to use [facility] for medication refills.

## 2018-01-17 DIAGNOSIS — E119 Type 2 diabetes mellitus without complications: Secondary | ICD-10-CM | POA: Diagnosis not present

## 2018-04-18 DIAGNOSIS — E119 Type 2 diabetes mellitus without complications: Secondary | ICD-10-CM | POA: Diagnosis not present

## 2018-05-21 DIAGNOSIS — H401131 Primary open-angle glaucoma, bilateral, mild stage: Secondary | ICD-10-CM | POA: Diagnosis not present

## 2018-07-18 DIAGNOSIS — E119 Type 2 diabetes mellitus without complications: Secondary | ICD-10-CM | POA: Diagnosis not present

## 2018-10-17 DIAGNOSIS — E119 Type 2 diabetes mellitus without complications: Secondary | ICD-10-CM | POA: Diagnosis not present

## 2018-12-14 ENCOUNTER — Telehealth: Payer: Self-pay | Admitting: Adult Health Nurse Practitioner

## 2018-12-14 NOTE — Telephone Encounter (Signed)
Pt has a TOC app on 01/06/19 with you. Pt would like a refill on her losartan (COZAAR) 100 MG tablet [[id number]] and metFORMIN (GLUCOPHAGE) 1000 MG tablet [[id number]] Pharmacy:  [facility], [place].Marland Kitchen She is going out of town and this is the first appointment she could get after her trip.  Please advise at [phone]

## 2018-12-16 ENCOUNTER — Other Ambulatory Visit: Payer: Self-pay | Admitting: *Deleted

## 2018-12-16 DIAGNOSIS — E1165 Type 2 diabetes mellitus with hyperglycemia: Secondary | ICD-10-CM

## 2018-12-16 MED ORDER — METFORMIN HCL 1000 MG PO TABS: ORAL_TABLET | ORAL | 0 refills | Status: DC

## 2018-12-16 NOTE — Telephone Encounter (Signed)
Prescription sent

## 2018-12-18 NOTE — Telephone Encounter (Signed)
Pt states she did not receive the Losartan prescription. Pharmacy faxed over request. Pt would like refill to be completed today. Pt expecting cb at [phone]

## 2018-12-21 ENCOUNTER — Other Ambulatory Visit: Payer: Self-pay | Admitting: Physician Assistant

## 2018-12-21 DIAGNOSIS — I10 Essential (primary) hypertension: Secondary | ICD-10-CM

## 2018-12-21 NOTE — Telephone Encounter (Signed)
Pharmacy requesting losartan (COZAAR) 100 MG tablet stating several request were fax.   [place], [place]. [phone] (Phone) [phone] (Fax)

## 2018-12-21 NOTE — Telephone Encounter (Signed)
Requested medication (s) are due for refill today: yes  Requested medication (s) are on the active medication list: yes  Last refill: 10/31/2017  Future visit scheduled: yes  Notes to clinic:  Review for refill   Requested Prescriptions  Pending Prescriptions Disp Refills   losartan (COZAAR) 100 MG tablet 90 tablet 3    Sig: Take 1 tablet (100 mg total) by mouth daily.     Cardiovascular:  Angiotensin Receptor Blockers Failed - 12/21/2018 12:30 PM      Failed - Cr in normal range and within 180 days    Creat  Date Value Ref Range Status  10/26/2015 1.02 (H) 0.60 - 0.93 mg/dL Final    Comment:      For patients > or = 74 years of age: The upper reference limit for Creatinine is approximately 13% higher for people identified as African-American.      Creatinine, Ser  Date Value Ref Range Status  11/20/2017 0.94 0.57 - 1.00 mg/dL Final         Failed - K in normal range and within 180 days    Potassium  Date Value Ref Range Status  11/20/2017 4.4 3.5 - 5.2 mmol/L Final         Failed - Valid encounter within last 6 months    Recent Outpatient Visits          1 year ago Post-menopausal   [facility] [place], PA-C   1 year ago Essential hypertension   [facility], Dam[facility], PA-C   2 years ago Essential hypertension   [facility] [place], PA-C   2 years ago Uncontrolled type 2 diabetes mellitus without complication, without long-term current use of insulin [facility])   [facility] [place], PA-C   3 years ago Essential hypertension   [facility] [place], PA-C      Future Appointments            In 2 weeks Wendall Mola, NP [facility] [place], [place] - Patient is not pregnant      Passed - Last BP in normal range    BP Readings from Last 1 Encounters:  11/20/17 118/80

## 2018-12-22 ENCOUNTER — Other Ambulatory Visit: Payer: Self-pay | Admitting: *Deleted

## 2018-12-22 DIAGNOSIS — I10 Essential (primary) hypertension: Secondary | ICD-10-CM

## 2018-12-22 MED ORDER — LOSARTAN POTASSIUM 100 MG PO TABS: 100.0000 mg | ORAL_TABLET | Freq: Every day | ORAL | 0 refills | Status: DC

## 2018-12-22 NOTE — Telephone Encounter (Signed)
Prescription sent

## 2019-01-06 ENCOUNTER — Encounter: Payer: Medicare HMO | Admitting: Adult Health Nurse Practitioner

## 2019-01-16 DIAGNOSIS — E119 Type 2 diabetes mellitus without complications: Secondary | ICD-10-CM | POA: Diagnosis not present

## 2019-01-20 ENCOUNTER — Encounter: Payer: Self-pay | Admitting: Adult Health Nurse Practitioner

## 2019-01-20 ENCOUNTER — Other Ambulatory Visit: Payer: Self-pay

## 2019-01-20 ENCOUNTER — Ambulatory Visit (INDEPENDENT_AMBULATORY_CARE_PROVIDER_SITE_OTHER): Payer: Medicare HMO | Admitting: Adult Health Nurse Practitioner

## 2019-01-20 VITALS — BP 158/82 | HR 64 | Temp 98.4°F | Ht 60.0 in | Wt 159.0 lb

## 2019-01-20 DIAGNOSIS — E1165 Type 2 diabetes mellitus with hyperglycemia: Secondary | ICD-10-CM | POA: Diagnosis not present

## 2019-01-20 DIAGNOSIS — E7800 Pure hypercholesterolemia, unspecified: Secondary | ICD-10-CM

## 2019-01-20 DIAGNOSIS — E559 Vitamin D deficiency, unspecified: Secondary | ICD-10-CM

## 2019-01-20 DIAGNOSIS — I10 Essential (primary) hypertension: Secondary | ICD-10-CM

## 2019-01-20 NOTE — Progress Notes (Signed)
Established Patient Office Visit  Subjective:  Patient ID: Carly Moore, female    DOB: [birth year]  Age: 73 y.o. MRN: [id number]  CC: No chief complaint on file.   HPI Carly Moore presents for f/u DM, HTN, hyperlipidemia, and Vitamin D deficiency  Diabetes Mellitus Type 2 Follow Up Patient here for followup of Type 2 diabetes mellitus.  Feels this problems is controlled  Current medications for diabetes: @MEDSDMONLY @ Medication side effects? yes  Known diabetic complications: none Current symptoms/problems include none and have been unchanged.   Current diet: "when she eats rights and eats her fruits and vegetables, her BS is controlled" Current exercise: Patient has a split level and says she walks up and down those stairs 25x a day.  No sustained cardio . Days per week?  Current monitoring regimen: 3x a week Home blood sugar records: Patient declined to answer Any episodes of hypoglycemia? no  Hypertension Follow Up Patient is here to follow up on hypertension.  Feels this  problems is controlled Taking medications as prescribed? yes Medication side effects? no Denies:   Home blood pressure readings are not at goal .   Social History   Tobacco Use  Smoking Status Never Smoker  Smokeless Tobacco Never Used   BP Goal < 140/90.   BP Readings from Last 3 Encounters:  BP Readings from Last 3 Encounters:  01/20/19 (!) 158/82  11/20/17 118/80  04/11/17 134/64     Hyperlipidemia: Follow-Up:  Current symptoms: none Patient denies palpitations, syncope, orthopnea, edema and or paroxysmal nocturnal dyspnea  Current medication: none.  She is not interested in a statin    Most recent labs: HbA1c:  Lab Results  Component Value Date   HGBA1C 9.2 (H) 11/20/2017   HGBA1C 10.5 (H) 04/11/2017   HGBA1C 8.2 09/16/2016   Lipid:  No components found for: CHOLTOTAL Lab Results  Component Value Date   HDL 52 04/11/2017   HDL 68 10/26/2015   HDL 60 04/28/2013    Lab Results  Component Value Date   LDLCALC 175 (H) 04/11/2017   LDLCALC 179 (H) 10/26/2015   LDLCALC 145 (H) 04/28/2013   Lab Results  Component Value Date   TRIG 256 (H) 04/11/2017   TRIG 258 (H) 10/26/2015   TRIG 246 (H) 04/28/2013   Last GFR:No results found for: GFR Creatinine:  Lab Results  Component Value Date   CREATININE 0.94 11/20/2017   Urine microalbumin: No components found for: MALBDUAP  The 10-year ASCVD risk score Mikey Bussing DC Jr., et al., 2013) is: 48.1%   Values used to calculate the score:     Age: 79 years     Sex: Female     Is Non-Hispanic African American: No     Diabetic: Yes     Tobacco smoker: No     Systolic Blood Pressure: 500 mmHg     Is BP treated: Yes     HDL Cholesterol: 52 mg/dL     Total Cholesterol: 278 mg/dL   Past Medical History:  Diagnosis Date  . Diabetes type 2, controlled ([place])   . Glaucoma   . HTN (hypertension)   . Hyperlipidemia     History reviewed. No pertinent surgical history.  Family History  Problem Relation Age of Onset  . Heart disease Mother   . Cancer Father   . Diabetes Father   . Alcohol abuse Brother     Social History   Socioeconomic History  . Marital status: Single    Spouse name:  Not on file  . Number of children: Not on file  . Years of education: Not on file  . Highest education level: Not on file  Occupational History  . Not on file  Social Needs  . Financial resource strain: Not on file  . Food insecurity    Worry: Not on file    Inability: Not on file  . Transportation needs    Medical: Not on file    Non-medical: Not on file  Tobacco Use  . Smoking status: Never Smoker  . Smokeless tobacco: Never Used  Substance and Sexual Activity  . Alcohol use: Yes  . Drug use: No  . Sexual activity: Not Currently  Lifestyle  . Physical activity    Days per week: Not on file    Minutes per session: Not on file  . Stress: Not on file  Relationships  . Social Musicianconnections    Talks on  phone: Not on file    Gets together: Not on file    Attends religious service: Not on file    Active member of club or organization: Not on file    Attends meetings of clubs or organizations: Not on file    Relationship status: Not on file  . Intimate partner violence    Fear of current or ex partner: Not on file    Emotionally abused: Not on file    Physically abused: Not on file    Forced sexual activity: Not on file  Other Topics Concern  . Not on file  Social History Narrative   Single   1 son    Outpatient Medications Prior to Visit  Medication Sig Dispense Refill  . Calcium-Magnesium-Vitamin D (CALCIUM MAGNESIUM PO) Take by mouth daily.    . chlorthalidone (HYGROTON) 50 MG tablet Take 1 tablet (50 mg total) by mouth daily. 90 tablet 3  . co-enzyme Q-10 30 MG capsule Take 30 mg by mouth 3 (three) times daily.      . COMBIGAN 0.2-0.5 % ophthalmic solution INSTILL 1 DROP INTO EACH EYE TWICE DAILY    . fish oil-omega-3 fatty acids 1000 MG capsule Take 2 g by mouth daily.      Marland Kitchen. glucosamine-chondroitin 500-400 MG tablet Take 1 tablet by mouth 3 (three) times daily.      Marland Kitchen. latanoprost (XALATAN) 0.005 % ophthalmic solution     . losartan (COZAAR) 100 MG tablet Take 1 tablet (100 mg total) by mouth daily. 90 tablet 0  . metFORMIN (GLUCOPHAGE) 1000 MG tablet TAKE ONE TABLET BY MOUTH TWICE DAILY 60 tablet 0  . VITAMIN D, CHOLECALCIFEROL, PO Take by mouth daily.     No facility-administered medications prior to visit.     Allergies  Allergen Reactions  . Sulfa Antibiotics Rash    ROS Review of Systems Review of Systems  Constitutional: Negative for activity change, appetite change, chills and fever.  HENT: Negative for congestion, nosebleeds, trouble swallowing and voice change.   Respiratory: Negative for cough, shortness of breath and wheezing.   Gastrointestinal: Negative for diarrhea, nausea and vomiting.  Genitourinary: Negative for difficulty urinating, dysuria, flank  pain and hematuria.  Musculoskeletal: Negative for back pain, joint swelling and neck pain.  Neurological: Negative for dizziness, speech difficulty, light-headedness and numbness.  See HPI. All other review of systems negative.     Objective:    Physical Exam  BP (!) 158/82 (BP Location: Right Arm, Patient Position: Sitting, Cuff Size: Normal)   Pulse 64   Temp  98.4 F (36.9 C)   Ht 5' (1.524 m)   Wt 159 lb (72.1 kg)   SpO2 95%   BMI 31.05 kg/m  Wt Readings from Last 3 Encounters:  01/20/19 159 lb (72.1 kg)  11/20/17 156 lb 6.4 oz (70.9 kg)  04/11/17 156 lb 12.8 oz (71.1 kg)   Physical Exam  Constitutional: Oriented to person, place, and time. Appears well-developed and well-nourished.  HENT:  Head: Normocephalic and atraumatic.  Eyes: Conjunctivae and EOM are normal.  Cardiovascular: Normal rate, regular rhythm, normal heart sounds and intact distal pulses.  No murmur heard. Pulmonary/Chest: Effort normal and breath sounds normal. No stridor.  No respiratory distress. Has no wheezes.  Neurological: Is alert and oriented to person, place, and time.  Skin: Skin is warm. Capillary refill takes less than 2 seconds.  Psychiatric: Has a normal mood and affect. Behavior is normal. Judgment and thought content normal.     Health Maintenance Due  Topic Date Due  . COLONOSCOPY  10/17/1994  . MAMMOGRAM  05/05/2014  . OPHTHALMOLOGY EXAM  03/25/2017  . FOOT EXAM  04/11/2018  . HEMOGLOBIN A1C  05/23/2018    There are no preventive care reminders to display for this patient.  Lab Results  Component Value Date   TSH 1.160 04/28/2013   Lab Results  Component Value Date   WBC 8.3 04/28/2013   HGB 14.3 04/28/2013   HCT 41.2 04/28/2013   MCV 91.2 04/28/2013   PLT 288 04/28/2013   Lab Results  Component Value Date   NA 139 11/20/2017   K 4.4 11/20/2017   CO2 21 11/20/2017   GLUCOSE 248 (H) 11/20/2017   BUN 10 11/20/2017   CREATININE 0.94 11/20/2017   BILITOT 0.4  11/20/2017   ALKPHOS 97 11/20/2017   AST 30 11/20/2017   ALT 32 11/20/2017   PROT 6.9 11/20/2017   ALBUMIN 4.0 11/20/2017   CALCIUM 9.5 11/20/2017   Lab Results  Component Value Date   CHOL 278 (H) 04/11/2017   Lab Results  Component Value Date   HDL 52 04/11/2017   Lab Results  Component Value Date   LDLCALC 175 (H) 04/11/2017   Lab Results  Component Value Date   TRIG 256 (H) 04/11/2017   Lab Results  Component Value Date   CHOLHDL 5.3 (H) 04/11/2017   Lab Results  Component Value Date   HGBA1C 9.2 (H) 11/20/2017      Assessment & Plan:   1. Vitamin D deficiency   2. Pure hypercholesterolemia   3. Uncontrolled type 2 diabetes mellitus with hyperglycemia (HCC)   4. Essential hypertension     Lab Orders     Hemoglobin A1c     Lipid panel     Comprehensive metabolic panel     CBC with Differential/Platelet     VITAMIN D 25 Hydroxy (Vit-D Deficiency, Fractures)     TSH   Follow-up: 3 months   Encouraged the patient to continue taking Metformin.  Discussed Statin and side effects.  She researches medications and is not sure she wants to be on Metformin longer.       Elyse Jarvis, NP

## 2019-01-21 LAB — COMPREHENSIVE METABOLIC PANEL
ALT: 36 IU/L — ABNORMAL HIGH (ref 0–32)
AST: 50 IU/L — ABNORMAL HIGH (ref 0–40)
Albumin/Globulin Ratio: 1.7 (ref 1.2–2.2)
Albumin: 4.3 g/dL (ref 3.7–4.7)
Alkaline Phosphatase: 113 IU/L (ref 39–117)
BUN/Creatinine Ratio: 9 — ABNORMAL LOW (ref 12–28)
BUN: 9 mg/dL (ref 8–27)
Bilirubin Total: 0.4 mg/dL (ref 0.0–1.2)
CO2: 22 mmol/L (ref 20–29)
Calcium: 9.7 mg/dL (ref 8.7–10.3)
Chloride: 100 mmol/L (ref 96–106)
Creatinine, Ser: 0.98 mg/dL (ref 0.57–1.00)
GFR calc Af Amer: 66 mL/min/{1.73_m2} (ref >59)
GFR calc non Af Amer: 57 mL/min/{1.73_m2} — ABNORMAL LOW (ref >59)
Globulin, Total: 2.6 g/dL (ref 1.5–4.5)
Glucose: 265 mg/dL — ABNORMAL HIGH (ref 65–99)
Potassium: 4.5 mmol/L (ref 3.5–5.2)
Sodium: 139 mmol/L (ref 134–144)
Total Protein: 6.9 g/dL (ref 6.0–8.5)

## 2019-01-21 LAB — LIPID PANEL
Chol/HDL Ratio: 5.7 ratio — ABNORMAL HIGH (ref 0.0–4.4)
Cholesterol, Total: 301 mg/dL — ABNORMAL HIGH (ref 100–199)
HDL: 53 mg/dL (ref >39)
LDL Chol Calc (NIH): 198 mg/dL — ABNORMAL HIGH (ref 0–99)
Triglycerides: 257 mg/dL — ABNORMAL HIGH (ref 0–149)
VLDL Cholesterol Cal: 50 mg/dL — ABNORMAL HIGH (ref 5–40)

## 2019-01-21 LAB — VITAMIN D 25 HYDROXY (VIT D DEFICIENCY, FRACTURES): Vit D, 25-Hydroxy: 21.6 ng/mL — ABNORMAL LOW (ref 30.0–100.0)

## 2019-01-21 LAB — HEMOGLOBIN A1C
Est. average glucose Bld gHb Est-mCnc: 272 mg/dL
Hgb A1c MFr Bld: 11.1 % — ABNORMAL HIGH (ref 4.8–5.6)

## 2019-01-21 LAB — TSH: TSH: 0.934 u[IU]/mL (ref 0.450–4.500)

## 2019-01-22 ENCOUNTER — Encounter: Payer: Self-pay | Admitting: Adult Health Nurse Practitioner

## 2019-01-29 ENCOUNTER — Telehealth: Payer: Self-pay

## 2019-01-29 NOTE — Telephone Encounter (Signed)
I was unable to reach patient by phone.  Please contact her to schedule f/u with Jens Som to review labs.   Thank you.

## 2019-02-09 DIAGNOSIS — E119 Type 2 diabetes mellitus without complications: Secondary | ICD-10-CM | POA: Diagnosis not present

## 2019-02-09 DIAGNOSIS — Z961 Presence of intraocular lens: Secondary | ICD-10-CM | POA: Diagnosis not present

## 2019-02-09 DIAGNOSIS — H401132 Primary open-angle glaucoma, bilateral, moderate stage: Secondary | ICD-10-CM | POA: Diagnosis not present

## 2019-02-09 DIAGNOSIS — H527 Unspecified disorder of refraction: Secondary | ICD-10-CM | POA: Diagnosis not present

## 2019-02-11 ENCOUNTER — Encounter: Payer: Self-pay | Admitting: Radiology

## 2019-02-22 ENCOUNTER — Ambulatory Visit: Payer: Medicare HMO | Admitting: Adult Health Nurse Practitioner

## 2019-02-22 ENCOUNTER — Other Ambulatory Visit: Payer: Self-pay

## 2019-02-22 VITALS — BP 152/82 | Ht 61.0 in | Wt 159.0 lb

## 2019-02-22 DIAGNOSIS — Z0000 Encounter for general adult medical examination without abnormal findings: Secondary | ICD-10-CM

## 2019-02-22 NOTE — Patient Instructions (Signed)
Thank you for taking time to come for your Medicare Wellness Visit. I appreciate your ongoing commitment to your health goals. Please review the following plan we discussed and let me know if I can assist you in the future.    LPN   Preventive Care 74 Years and Older, Female Preventive care refers to lifestyle choices and visits with your health care provider that can promote health and wellness. This includes:  A yearly physical exam. This is also called an annual well check.  Regular dental and eye exams.  Immunizations.  Screening for certain conditions.  Healthy lifestyle choices, such as diet and exercise. What can I expect for my preventive care visit? Physical exam Your health care provider will check:  Height and weight. These may be used to calculate body mass index (BMI), which is a measurement that tells if you are at a healthy weight.  Heart rate and blood pressure.  Your skin for abnormal spots. Counseling Your health care provider may ask you questions about:  Alcohol, tobacco, and drug use.  Emotional well-being.  Home and relationship well-being.  Sexual activity.  Eating habits.  History of falls.  Memory and ability to understand (cognition).  Work and work environment.  Pregnancy and menstrual history. What immunizations do I need?  Influenza (flu) vaccine  This is recommended every year. Tetanus, diphtheria, and pertussis (Tdap) vaccine  You may need a Td booster every 10 years. Varicella (chickenpox) vaccine  You may need this vaccine if you have not already been vaccinated. Zoster (shingles) vaccine  You may need this after age 60. Pneumococcal conjugate (PCV13) vaccine  One dose is recommended after age 74. Pneumococcal polysaccharide (PPSV23) vaccine  One dose is recommended after age 74. Measles, mumps, and rubella (MMR) vaccine  You may need at least one dose of MMR if you were born in 1957 or later. You may also  need a second dose. Meningococcal conjugate (MenACWY) vaccine  You may need this if you have certain conditions. Hepatitis A vaccine  You may need this if you have certain conditions or if you travel or work in places where you may be exposed to hepatitis A. Hepatitis B vaccine  You may need this if you have certain conditions or if you travel or work in places where you may be exposed to hepatitis B. Haemophilus influenzae type b (Hib) vaccine  You may need this if you have certain conditions. You may receive vaccines as individual doses or as more than one vaccine together in one shot (combination vaccines). Talk with your health care provider about the risks and benefits of combination vaccines. What tests do I need? Blood tests  Lipid and cholesterol levels. These may be checked every 5 years, or more frequently depending on your overall health.  Hepatitis C test.  Hepatitis B test. Screening  Lung cancer screening. You may have this screening every year starting at age 74 if you have a 30-pack-year history of smoking and currently smoke or have quit within the past 15 years.  Colorectal cancer screening. All adults should have this screening starting at age 74 and continuing until age 75. Your health care provider may recommend screening at age 45 if you are at increased risk. You will have tests every 1-10 years, depending on your results and the type of screening test.  Diabetes screening. This is done by checking your blood sugar (glucose) after you have not eaten for a while (fasting). You may have this done every   1-3 years.  Mammogram. This may be done every 1-2 years. Talk with your health care provider about how often you should have regular mammograms.  BRCA-related cancer screening. This may be done if you have a family history of breast, ovarian, tubal, or peritoneal cancers. Other tests  Sexually transmitted disease (STD) testing.  Bone density scan. This is done  to screen for osteoporosis. You may have this done starting at age 74. Follow these instructions at home: Eating and drinking  Eat a diet that includes fresh fruits and vegetables, whole grains, lean protein, and low-fat dairy products. Limit your intake of foods with high amounts of sugar, saturated fats, and salt.  Take vitamin and mineral supplements as recommended by your health care provider.  Do not drink alcohol if your health care provider tells you not to drink.  If you drink alcohol: ? Limit how much you have to 0-1 drink a day. ? Be aware of how much alcohol is in your drink. In the [place] one drink equals one 12 oz bottle of beer (355 mL), one 5 oz glass of wine (148 mL), or one 1 oz glass of hard liquor (44 mL). Lifestyle  Take daily care of your teeth and gums.  Stay active. Exercise for at least 30 minutes on 5 or more days each week.  Do not use any products that contain nicotine or tobacco, such as cigarettes, e-cigarettes, and chewing tobacco. If you need help quitting, ask your health care provider.  If you are sexually active, practice safe sex. Use a condom or other form of protection in order to prevent STIs (sexually transmitted infections).  Talk with your health care provider about taking a low-dose aspirin or statin. What's next?  Go to your health care provider once a year for a well check visit.  Ask your health care provider how often you should have your eyes and teeth checked.  Stay up to date on all vaccines. This information is not intended to replace advice given to you by your health care provider. Make sure you discuss any questions you have with your health care provider. Document Released: 04/07/2015 Document Revised: 03/05/2018 Document Reviewed: 03/05/2018 Elsevier Patient Education  2020 Elsevier Inc.  

## 2019-02-22 NOTE — Progress Notes (Addendum)
Presents today for [employer] Visit   Date of last exam: 01/20/2019  Interpreter used for this visit?  No  I connected with  Carly Moore on 02/22/19 by a telephone application and verified that I am speaking with the correct person using two identifiers.   I discussed the limitations of evaluation and management by telemedicine. The patient expressed understanding and agreed to proceed.   Patient Care Team: Wendall Mola, NP as PCP - General (Adult Health Nurse Practitioner)   Other items to address today:   Discussed Eye/Dental Discussed immunizations Never had colonoscopy Declined  Follow up scheduled 04/21/2018 With Jens Som    Other Screening: Last screening for diabetes: 01/20/2019 Last lipid screening: 01/20/2019  ADVANCE DIRECTIVES: Discussed yes On File: no Materials Provided:  yes  Immunization status:  Immunization History  Administered Date(s) Administered  . Influenza Split 12/25/2011  . Influenza,inj,Quad PF,6+ Mos 04/28/2013, 02/03/2014, 03/07/2015     Health Maintenance Due  Topic Date Due  . MAMMOGRAM  05/05/2014  . FOOT EXAM  04/11/2018     Functional Status Survey: Is the patient deaf or have difficulty hearing?: No Does the patient have difficulty seeing, even when wearing glasses/contacts?: No Does the patient have difficulty concentrating, remembering, or making decisions?: No Does the patient have difficulty walking or climbing stairs?: No Does the patient have difficulty dressing or bathing?: No Does the patient have difficulty doing errands alone such as visiting a doctor's office or shopping?: No   6CIT Screen 02/22/2019  What Year? 0 points  What month? 0 points  What time? 0 points  Count back from 20 0 points  Months in reverse 0 points  Repeat phrase 0 points  Total Score 0        Clinical Support from 02/22/2019 in [facility] at [facility]  AUDIT-C Score  2       Home Environment:   Patient has some a cleaning crew come in to do a major cleaning 2 weeks.  Lives in split level home No trouble climbing stairs No scattered rugs No grab bars Adequate lighting/no clutter   Patient Active Problem List   Diagnosis Date Noted  . Glaucoma   . HTN (hypertension)   . Diabetes type 2, uncontrolled ([place])   . Hyperlipidemia      Past Medical History:  Diagnosis Date  . Diabetes type 2, controlled ([place])   . Glaucoma   . HTN (hypertension)   . Hyperlipidemia      History reviewed. No pertinent surgical history.   Family History  Problem Relation Age of Onset  . Heart disease Mother   . Cancer Father   . Diabetes Father   . Alcohol abuse Brother      Social History   Socioeconomic History  . Marital status: Single    Spouse name: Not on file  . Number of children: Not on file  . Years of education: Not on file  . Highest education level: Not on file  Occupational History  . Not on file  Social Needs  . Financial resource strain: Not on file  . Food insecurity    Worry: Not on file    Inability: Not on file  . Transportation needs    Medical: Not on file    Non-medical: Not on file  Tobacco Use  . Smoking status: Never Smoker  . Smokeless tobacco: Never Used  Substance and Sexual Activity  . Alcohol use: Yes  .  Drug use: No  . Sexual activity: Not Currently  Lifestyle  . Physical activity    Days per week: Not on file    Minutes per session: Not on file  . Stress: Not on file  Relationships  . Social Musician on phone: Not on file    Gets together: Not on file    Attends religious service: Not on file    Active member of club or organization: Not on file    Attends meetings of clubs or organizations: Not on file    Relationship status: Not on file  . Intimate partner violence    Fear of current or ex partner: Not on file    Emotionally abused: Not on file    Physically abused: Not on file    Forced sexual activity: Not  on file  Other Topics Concern  . Not on file  Social History Narrative   Single   1 son     Allergies  Allergen Reactions  . Sulfa Antibiotics Rash     Prior to Admission medications   Medication Sig Start Date End Date Taking? Authorizing Provider  chlorthalidone (HYGROTON) 50 MG tablet Take 1 tablet (50 mg total) by mouth daily. 11/20/17  Yes Weber, Dema Severin, PA-C  cholecalciferol (VITAMIN D3) 25 MCG (1000 UT) tablet Take 1,000 Units by mouth daily.   Yes [provider]  co-enzyme Q-10 30 MG capsule Take 30 mg by mouth 3 (three) times daily.     Yes [provider]  COMBIGAN 0.2-0.5 % ophthalmic solution INSTILL 1 DROP INTO EACH EYE TWICE DAILY 11/27/18  Yes [provider]  fish oil-omega-3 fatty acids 1000 MG capsule Take 2 g by mouth daily.     Yes [provider]  glucosamine-chondroitin 500-400 MG tablet Take 1 tablet by mouth 3 (three) times daily.     Yes [provider]  latanoprost (XALATAN) 0.005 % ophthalmic solution  02/18/15  Yes [provider]  losartan (COZAAR) 100 MG tablet Take 1 tablet (100 mg total) by mouth daily. 12/22/18  Yes Royal Hawthorn, NP  metFORMIN (GLUCOPHAGE) 1000 MG tablet TAKE ONE TABLET BY MOUTH TWICE DAILY 12/16/18  Yes Royal Hawthorn, NP  Calcium-Magnesium-Vitamin D (CALCIUM MAGNESIUM PO) Take by mouth daily.    [provider]  VITAMIN D, CHOLECALCIFEROL, PO Take by mouth daily.    [provider]     Depression screen [facility] 2/9 02/22/2019 01/20/2019 04/11/2017 09/10/2016 05/21/2016  Decreased Interest 0 0 0 0 0  Down, Depressed, Hopeless 0 0 0 0 0  PHQ - 2 Score 0 0 0 0 0     Fall Risk  02/22/2019 01/20/2019 04/11/2017 09/10/2016 05/21/2016  Falls in the past year? 0 0 No No No  Number falls in past yr: 0 0 - - -  Injury with Fall? 0 0 - - -  Follow up Falls evaluation completed;Education provided - - - -      PHYSICAL EXAM: BP (!) 152/82 Comment: previous  visit taken  Ht 5\' 1"  (1.549 m)   Wt 159 lb (72.1 kg)   BMI 30.04 kg/m    Wt Readings from Last 3 Encounters:  02/22/19 159 lb (72.1 kg)  01/20/19 159 lb (72.1 kg)  11/20/17 156 lb 6.4 oz (70.9 kg)    Medicare annual wellness visit, subsequent   Education/Counseling provided regarding diet and exercise, prevention of chronic diseases, smoking/tobacco cessation, if applicable, and reviewed "Covered Medicare Preventive  Services."  .

## 2019-03-24 ENCOUNTER — Telehealth: Payer: Self-pay

## 2019-03-24 ENCOUNTER — Other Ambulatory Visit: Payer: Self-pay | Admitting: Emergency Medicine

## 2019-03-24 DIAGNOSIS — I10 Essential (primary) hypertension: Secondary | ICD-10-CM

## 2019-03-24 DIAGNOSIS — E1165 Type 2 diabetes mellitus with hyperglycemia: Secondary | ICD-10-CM

## 2019-03-24 MED ORDER — LOSARTAN POTASSIUM 100 MG PO TABS: 100.0000 mg | ORAL_TABLET | Freq: Every day | ORAL | 0 refills | Status: DC

## 2019-03-24 MED ORDER — METFORMIN HCL 1000 MG PO TABS: ORAL_TABLET | ORAL | 0 refills | Status: DC

## 2019-03-24 NOTE — Telephone Encounter (Signed)
Pt. Called requesting refill on metformin and losartin to last until their appt. On 04/21/2018 at the least   To be sent to [facility] as usual

## 2019-03-24 NOTE — Telephone Encounter (Signed)
Pt. Called back to request it be for atleast 3 months

## 2019-03-24 NOTE — Telephone Encounter (Signed)
Rx has been sent  

## 2019-04-17 DIAGNOSIS — E119 Type 2 diabetes mellitus without complications: Secondary | ICD-10-CM | POA: Diagnosis not present

## 2019-04-22 ENCOUNTER — Ambulatory Visit: Payer: Medicare HMO | Admitting: Adult Health Nurse Practitioner

## 2019-05-12 ENCOUNTER — Other Ambulatory Visit: Payer: Self-pay

## 2019-05-12 ENCOUNTER — Ambulatory Visit (INDEPENDENT_AMBULATORY_CARE_PROVIDER_SITE_OTHER): Payer: Medicare HMO | Admitting: Adult Health Nurse Practitioner

## 2019-05-12 VITALS — BP 170/70 | HR 89 | Temp 97.8°F | Ht 62.0 in | Wt 158.2 lb

## 2019-05-12 DIAGNOSIS — Z9114 Patient's other noncompliance with medication regimen: Secondary | ICD-10-CM | POA: Diagnosis not present

## 2019-05-12 DIAGNOSIS — I10 Essential (primary) hypertension: Secondary | ICD-10-CM

## 2019-05-12 DIAGNOSIS — Z91148 Patient's other noncompliance with medication regimen for other reason: Secondary | ICD-10-CM

## 2019-05-12 DIAGNOSIS — E559 Vitamin D deficiency, unspecified: Secondary | ICD-10-CM | POA: Diagnosis not present

## 2019-05-12 DIAGNOSIS — E1165 Type 2 diabetes mellitus with hyperglycemia: Secondary | ICD-10-CM

## 2019-05-12 NOTE — Patient Instructions (Signed)
° ° ° °  If you have lab work done today you will be contacted with your lab results within the next 2 weeks.  If you have not heard from us then please contact us. The fastest way to get your results is to register for My Chart. ° ° °IF you received an x-ray today, you will receive an invoice from [facility]. Please contact [facility] at [phone] with questions or concerns regarding your invoice.  ° °IF you received labwork today, you will receive an invoice from [facility]. Please contact [facility] at [phone] with questions or concerns regarding your invoice.  ° °Our billing staff will not be able to assist you with questions regarding bills from these companies. ° °You will be contacted with the lab results as soon as they are available. The fastest way to get your results is to activate your My Chart account. Instructions are located on the last page of this paperwork. If you have not heard from us regarding the results in 2 weeks, please contact this office. °  ° ° ° °

## 2019-05-13 LAB — CMP14+EGFR
ALT: 30 IU/L (ref 0–32)
AST: 28 IU/L (ref 0–40)
Albumin/Globulin Ratio: 1.7 (ref 1.2–2.2)
Albumin: 4.3 g/dL (ref 3.7–4.7)
Alkaline Phosphatase: 112 IU/L (ref 39–117)
BUN/Creatinine Ratio: 10 — ABNORMAL LOW (ref 12–28)
BUN: 9 mg/dL (ref 8–27)
Bilirubin Total: 0.3 mg/dL (ref 0.0–1.2)
CO2: 20 mmol/L (ref 20–29)
Calcium: 9.3 mg/dL (ref 8.7–10.3)
Chloride: 99 mmol/L (ref 96–106)
Creatinine, Ser: 0.94 mg/dL (ref 0.57–1.00)
GFR calc Af Amer: 69 mL/min/{1.73_m2} (ref >59)
GFR calc non Af Amer: 60 mL/min/{1.73_m2} (ref >59)
Globulin, Total: 2.5 g/dL (ref 1.5–4.5)
Glucose: 274 mg/dL — ABNORMAL HIGH (ref 65–99)
Potassium: 4.4 mmol/L (ref 3.5–5.2)
Sodium: 138 mmol/L (ref 134–144)
Total Protein: 6.8 g/dL (ref 6.0–8.5)

## 2019-05-13 LAB — HEMOGLOBIN A1C
Est. average glucose Bld gHb Est-mCnc: 301 mg/dL
Hgb A1c MFr Bld: 12.1 % — ABNORMAL HIGH (ref 4.8–5.6)

## 2019-05-13 LAB — VITAMIN D 25 HYDROXY (VIT D DEFICIENCY, FRACTURES): Vit D, 25-Hydroxy: 29.8 ng/mL — ABNORMAL LOW (ref 30.0–100.0)

## 2019-06-11 DIAGNOSIS — Z9114 Patient's other noncompliance with medication regimen: Secondary | ICD-10-CM | POA: Insufficient documentation

## 2019-06-11 DIAGNOSIS — E559 Vitamin D deficiency, unspecified: Secondary | ICD-10-CM | POA: Insufficient documentation

## 2019-06-11 NOTE — Progress Notes (Signed)
History   Chief Complaint  Patient presents with  . Follow-up    [id number] Diabetes    HPI   Patient returns.  I have tried to get in touch with her since her last labs given their abnormality.  I have asked her to return.  She takes Glucophage for her DM.  She refuses to add any medications.  We discussed the pathophysiology of DM and the organ/eye damage that can result from uncontrolled blood sugar.  She communicates to me again that she researches things very carefully and that there are side effects of multiple medications.  I also explained that there are side effects of Diabetes such as renal failure and death.  I asked her to consider adding another medication such as Glipizide.   She checks blood sugars rare, if ever and cannot give an average range.  She is unterested in checking more.   BP also elevated today at 170/70 with losartan.  Unclear whether she is taking or not.  I have asked her to check blood pressures at home.  Will get another A1C today to determine the degree       Lab Results  Component Value Date   HGBA1C 12.1 (H) 05/12/2019    Past Medical History:  Diagnosis Date  . Diabetes type 2, controlled ([facility])   . Glaucoma   . HTN (hypertension)   . Hyperlipidemia    No past surgical history on file. Family History  Problem Relation Age of Onset  . Heart disease Mother   . Cancer Father   . Diabetes Father   . Alcohol abuse Brother    Social History   Tobacco Use  . Smoking status: Never Smoker  . Smokeless tobacco: Never Used  Substance Use Topics  . Alcohol use: Yes  . Drug use: No   OB History   No obstetric history on file.    Review of Systems   Review of Systems See HPI [place]: No fevers or chills No malaise No diaphoresis Skin: No rash or itching Eyes: no blurry vision, no double vision GU: no dysuria or hematuria Neuro: no dizziness or headaches    Allergies   Sulfa antibiotics  Home Medications    Current  Outpatient Medications:  .  Calcium-Magnesium-Vitamin D (CALCIUM MAGNESIUM PO), Take by mouth daily., Disp: , Rfl:  .  chlorthalidone (HYGROTON) 50 MG tablet, Take 1 tablet (50 mg total) by mouth daily., Disp: 90 tablet, Rfl: 3 .  cholecalciferol (VITAMIN D3) 25 MCG (1000 UT) tablet, Take 1,000 Units by mouth daily., Disp: , Rfl:  .  co-enzyme Q-10 30 MG capsule, Take 30 mg by mouth 3 (three) times daily.  , Disp: , Rfl:  .  COMBIGAN 0.2-0.5 % ophthalmic solution, INSTILL 1 DROP INTO EACH EYE TWICE DAILY, Disp: , Rfl:  .  fish oil-omega-3 fatty acids 1000 MG capsule, Take 2 g by mouth daily.  , Disp: , Rfl:  .  glucosamine-chondroitin 500-400 MG tablet, Take 1 tablet by mouth 3 (three) times daily.  , Disp: , Rfl:  .  latanoprost (XALATAN) 0.005 % ophthalmic solution, , Disp: , Rfl:  .  losartan (COZAAR) 100 MG tablet, Take 1 tablet (100 mg total) by mouth daily., Disp: 90 tablet, Rfl: 0 .  metFORMIN (GLUCOPHAGE) 1000 MG tablet, TAKE ONE TABLET BY MOUTH TWICE DAILY, Disp: 60 tablet, Rfl: 0 .  VITAMIN D, CHOLECALCIFEROL, PO, Take by mouth daily., Disp: , Rfl:   Meds Ordered and Administered this Visit  No orders of the defined types were placed in this encounter.   BP (!) 170/70 (BP Location: Right Arm, Patient Position: Sitting, Cuff Size: Normal)   Pulse 89   Temp 97.8 F (36.6 C) (Temporal)   Ht 5\' 2"  (1.575 m)   Wt 158 lb 3.2 oz (71.8 kg)   SpO2 95%   BMI 28.94 kg/m   Physical Exam  GEN: WDWN, NAD, Non-toxic, Alert & Oriented x 3 HEENT: Atraumatic, Normocephalic.  Ears and Nose: No external deformity. EXTR: No clubbing/cyanosis/edema NEURO: Normal gait.  PSYCH: Normally interactive. Conversant. Not depressed or anxious appearing.  Calm demeanor.    MDM   1. Essential hypertension   2. Uncontrolled type 2 diabetes mellitus with hyperglycemia ([place])   3. Vitamin D deficiency   4. Noncompliance with medications

## 2019-06-11 NOTE — Progress Notes (Signed)
Called pt, no anwer, no vm service

## 2019-06-14 NOTE — Progress Notes (Signed)
Spoke with pt, Pt is in [place]. To reach pt call [phone]. Pt was given the number to [facility] to follow up.

## 2019-06-29 DIAGNOSIS — Z0100 Encounter for examination of eyes and vision without abnormal findings: Secondary | ICD-10-CM | POA: Diagnosis not present

## 2019-07-01 ENCOUNTER — Other Ambulatory Visit: Payer: Self-pay

## 2019-07-01 ENCOUNTER — Telehealth: Payer: Self-pay | Admitting: Adult Health Nurse Practitioner

## 2019-07-01 DIAGNOSIS — I10 Essential (primary) hypertension: Secondary | ICD-10-CM

## 2019-07-01 MED ORDER — LOSARTAN POTASSIUM 100 MG PO TABS: 100.0000 mg | ORAL_TABLET | Freq: Every day | ORAL | 0 refills | Status: DC

## 2019-07-01 NOTE — Telephone Encounter (Signed)
Medication Refill - Medication: losartan (COZAAR) 100 MG tablet    Has the patient contacted their pharmacy? Yes.   (Agent: If no, request that the patient contact the pharmacy for the refill.) (Agent: If yes, when and what did the pharmacy advise?) pharmacy has reached out 3 times with no response   Preferred Pharmacy (with phone number or street name): [facility] [address] [place] - [address] Phone:  [phone]  Fax:  [phone]       Agent: Please be advised that RX refills may take up to 3 business days. We ask that you follow-up with your pharmacy.

## 2019-07-17 DIAGNOSIS — E119 Type 2 diabetes mellitus without complications: Secondary | ICD-10-CM | POA: Diagnosis not present

## 2019-09-14 ENCOUNTER — Encounter: Payer: Self-pay | Admitting: Emergency Medicine

## 2019-09-14 ENCOUNTER — Telehealth (INDEPENDENT_AMBULATORY_CARE_PROVIDER_SITE_OTHER): Payer: Medicare HMO | Admitting: Emergency Medicine

## 2019-09-14 ENCOUNTER — Other Ambulatory Visit: Payer: Self-pay

## 2019-09-14 DIAGNOSIS — I10 Essential (primary) hypertension: Secondary | ICD-10-CM

## 2019-09-14 DIAGNOSIS — E1165 Type 2 diabetes mellitus with hyperglycemia: Secondary | ICD-10-CM | POA: Diagnosis not present

## 2019-09-14 DIAGNOSIS — Z7689 Persons encountering health services in other specified circumstances: Secondary | ICD-10-CM | POA: Diagnosis not present

## 2019-09-14 MED ORDER — METFORMIN HCL 1000 MG PO TABS: 1000.0000 mg | ORAL_TABLET | Freq: Two times a day (BID) | ORAL | 0 refills | Status: DC

## 2019-09-14 MED ORDER — LOSARTAN POTASSIUM 100 MG PO TABS: 100.0000 mg | ORAL_TABLET | Freq: Every day | ORAL | 0 refills | Status: DC

## 2019-09-14 NOTE — Progress Notes (Signed)
Telemedicine Encounter- SOAP NOTE Established Patient MyChart video conference This video telephone encounter was conducted with the patient's (or proxy's) verbal consent via video audio telecommunications: yes/no: Yes Patient was instructed to have this encounter in a suitably private space; and to only have persons present to whom they give permission to participate. In addition, patient identity was confirmed by use of name plus two identifiers (DOB and address).  I discussed the limitations, risks, security and privacy concerns of performing an evaluation and management service by telephone and the availability of in person appointments. I also discussed with the patient that there may be a patient responsible charge related to this service. The patient expressed understanding and agreed to proceed.  I spent a total of TIME; 0 MIN TO 60 MIN: 20 minutes talking with the patient or their proxy.  Chief Complaint  Patient presents with  . Diabetes    pt requesting refill metformin, pt denies side effects, pt has been working on diet and exercise has had trouble the last month or two  . Hypertension    pt has ordered a new cuff but has not recieved it yet, pt denies physical symptoms     Subjective   Carly Moore is a 74 y.o. female established patient. Telephone visit today to establish care with me and for medication refill.  Has history of diabetes and hypertension.  Needs refill on Metformin and losartan.  First visit with me. No other complaints or medical concerns today.  HPI   Patient Active Problem List   Diagnosis Date Noted  . Vitamin D deficiency 06/11/2019  . Noncompliance with medications 06/11/2019  . Glaucoma   . HTN (hypertension)   . Diabetes type 2, uncontrolled (HCC)   . Hyperlipidemia     Past Medical History:  Diagnosis Date  . Diabetes mellitus without complication (HCC)    [employer] 09/13/2019  . Diabetes type 2, controlled (HCC)   . Glaucoma   .  HTN (hypertension)   . Hyperlipidemia   . Hypertension    [employer] 09/13/2019    Current Outpatient Medications  Medication Sig Dispense Refill  . Calcium-Magnesium-Vitamin D (CALCIUM MAGNESIUM PO) Take by mouth daily.    . chlorthalidone (HYGROTON) 50 MG tablet Take 1 tablet (50 mg total) by mouth daily. 90 tablet 3  . cholecalciferol (VITAMIN D3) 25 MCG (1000 UT) tablet Take 1,000 Units by mouth daily.    Marland Kitchen co-enzyme Q-10 30 MG capsule Take 30 mg by mouth 3 (three) times daily.      . COMBIGAN 0.2-0.5 % ophthalmic solution INSTILL 1 DROP INTO EACH EYE TWICE DAILY    . fish oil-omega-3 fatty acids 1000 MG capsule Take 2 g by mouth daily.      Marland Kitchen glucosamine-chondroitin 500-400 MG tablet Take 1 tablet by mouth 3 (three) times daily.      Marland Kitchen latanoprost (XALATAN) 0.005 % ophthalmic solution     . losartan (COZAAR) 100 MG tablet Take 1 tablet (100 mg total) by mouth daily. 90 tablet 0  . metFORMIN (GLUCOPHAGE) 1000 MG tablet TAKE ONE TABLET BY MOUTH TWICE DAILY 60 tablet 0  . VITAMIN D, CHOLECALCIFEROL, PO Take by mouth daily.     No current facility-administered medications for this visit.    Allergies  Allergen Reactions  . Sulfa Antibiotics Rash    Social History   Socioeconomic History  . Marital status: Single    Spouse name: Not on file  . Number of children: Not on file  .  Years of education: Not on file  . Highest education level: Not on file  Occupational History  . Not on file  Tobacco Use  . Smoking status: Never Smoker  . Smokeless tobacco: Never Used  Substance and Sexual Activity  . Alcohol use: Yes  . Drug use: No  . Sexual activity: Not Currently  Other Topics Concern  . Not on file  Social History Narrative   Single   1 son   Social Determinants of Health   Financial Resource Strain:   . Difficulty of Paying Living Expenses:   Food Insecurity:   . Worried About Charity fundraiser in the Last Year:   . Arboriculturist in the Last Year:     Transportation Needs:   . Film/video editor (Medical):   Marland Kitchen Lack of Transportation (Non-Medical):   Physical Activity:   . Days of Exercise per Week:   . Minutes of Exercise per Session:   Stress:   . Feeling of Stress :   Social Connections:   . Frequency of Communication with Friends and Family:   . Frequency of Social Gatherings with Friends and Family:   . Attends Religious Services:   . Active Member of Clubs or Organizations:   . Attends Archivist Meetings:   Marland Kitchen Marital Status:   Intimate Partner Violence:   . Fear of Current or Ex-Partner:   . Emotionally Abused:   Marland Kitchen Physically Abused:   . Sexually Abused:     Review of Systems  Constitutional: Negative.  Negative for chills and fever.  HENT: Negative.  Negative for congestion and sore throat.   Respiratory: Negative.  Negative for cough and shortness of breath.   Cardiovascular: Negative.  Negative for chest pain and palpitations.  Gastrointestinal: Negative for abdominal pain, diarrhea, nausea and vomiting.  Genitourinary: Negative.  Negative for hematuria.  Musculoskeletal: Negative.  Negative for myalgias and neck pain.  Skin: Negative.  Negative for rash.  Neurological: Negative for dizziness and headaches.  All other systems reviewed and are negative.   Objective  Alert and oriented x3 in no apparent respiratory distress Vitals as reported by the patient: There were no vitals filed for this visit.  Carly Moore was seen today for diabetes and hypertension.  Diagnoses and all orders for this visit:  Essential hypertension -     losartan (COZAAR) 100 MG tablet; Take 1 tablet (100 mg total) by mouth daily.  Uncontrolled type 2 diabetes mellitus with hyperglycemia (HCC) -     metFORMIN (GLUCOPHAGE) 1000 MG tablet; Take 1 tablet (1,000 mg total) by mouth 2 (two) times daily with a meal. TAKE ONE TABLET BY MOUTH TWICE DAILY  Encounter to establish care   Follow-up with me as scheduled next  August.   I discussed the assessment and treatment plan with the patient. The patient was provided an opportunity to ask questions and all were answered. The patient agreed with the plan and demonstrated an understanding of the instructions.   The patient was advised to call back or seek an in-person evaluation if the symptoms worsen or if the condition fails to improve as anticipated.  I provided 20 minutes of non-face-to-face time during this encounter.  Horald Pollen, MD  Primary Care at [facility]

## 2019-09-28 DIAGNOSIS — H401132 Primary open-angle glaucoma, bilateral, moderate stage: Secondary | ICD-10-CM | POA: Diagnosis not present

## 2019-10-16 DIAGNOSIS — E119 Type 2 diabetes mellitus without complications: Secondary | ICD-10-CM | POA: Diagnosis not present

## 2019-11-09 ENCOUNTER — Other Ambulatory Visit: Payer: Self-pay

## 2019-11-09 ENCOUNTER — Ambulatory Visit (INDEPENDENT_AMBULATORY_CARE_PROVIDER_SITE_OTHER): Payer: Medicare HMO | Admitting: Emergency Medicine

## 2019-11-09 ENCOUNTER — Encounter: Payer: Self-pay | Admitting: Emergency Medicine

## 2019-11-09 VITALS — BP 156/82 | HR 100 | Temp 98.1°F | Resp 18 | Ht 62.0 in | Wt 155.2 lb

## 2019-11-09 DIAGNOSIS — Z8669 Personal history of other diseases of the nervous system and sense organs: Secondary | ICD-10-CM | POA: Insufficient documentation

## 2019-11-09 DIAGNOSIS — E1165 Type 2 diabetes mellitus with hyperglycemia: Secondary | ICD-10-CM

## 2019-11-09 DIAGNOSIS — E559 Vitamin D deficiency, unspecified: Secondary | ICD-10-CM

## 2019-11-09 DIAGNOSIS — Z7689 Persons encountering health services in other specified circumstances: Secondary | ICD-10-CM | POA: Diagnosis not present

## 2019-11-09 DIAGNOSIS — E785 Hyperlipidemia, unspecified: Secondary | ICD-10-CM

## 2019-11-09 DIAGNOSIS — E1159 Type 2 diabetes mellitus with other circulatory complications: Secondary | ICD-10-CM | POA: Diagnosis not present

## 2019-11-09 DIAGNOSIS — I152 Hypertension secondary to endocrine disorders: Secondary | ICD-10-CM

## 2019-11-09 DIAGNOSIS — I10 Essential (primary) hypertension: Secondary | ICD-10-CM

## 2019-11-09 MED ORDER — METFORMIN HCL 1000 MG PO TABS: 1000.0000 mg | ORAL_TABLET | Freq: Two times a day (BID) | ORAL | 3 refills | Status: DC

## 2019-11-09 MED ORDER — LOSARTAN POTASSIUM 100 MG PO TABS: 100.0000 mg | ORAL_TABLET | Freq: Every day | ORAL | 3 refills | Status: DC

## 2019-11-09 MED ORDER — CHLORTHALIDONE 50 MG PO TABS: 50.0000 mg | ORAL_TABLET | Freq: Every day | ORAL | 3 refills | Status: DC

## 2019-11-09 NOTE — Patient Instructions (Addendum)
   If you have lab work done today you will be contacted with your lab results within the next 2 weeks.  If you have not heard from us then please contact us. The fastest way to get your results is to register for My Chart.   IF you received an x-ray today, you will receive an invoice from [facility]. Please contact [facility] at [phone] with questions or concerns regarding your invoice.   IF you received labwork today, you will receive an invoice from [facility]. Please contact [facility] at [phone] with questions or concerns regarding your invoice.   Our billing staff will not be able to assist you with questions regarding bills from these companies.  You will be contacted with the lab results as soon as they are available. The fastest way to get your results is to activate your My Chart account. Instructions are located on the last page of this paperwork. If you have not heard from us regarding the results in 2 weeks, please contact this office.     Health Maintenance After Age 65 After age 65, you are at a higher risk for certain long-term diseases and infections as well as injuries from falls. Falls are a major cause of broken bones and head injuries in people who are older than age 65. Getting regular preventive care can help to keep you healthy and well. Preventive care includes getting regular testing and making lifestyle changes as recommended by your health care provider. Talk with your health care provider about:  Which screenings and tests you should have. A screening is a test that checks for a disease when you have no symptoms.  A diet and exercise plan that is right for you. What should I know about screenings and tests to prevent falls? Screening and testing are the best ways to find a health problem early. Early diagnosis and treatment give you the best chance of managing medical conditions that are common after age 65. Certain conditions and  lifestyle choices may make you more likely to have a fall. Your health care provider may recommend:  Regular vision checks. Poor vision and conditions such as cataracts can make you more likely to have a fall. If you wear glasses, make sure to get your prescription updated if your vision changes.  Medicine review. Work with your health care provider to regularly review all of the medicines you are taking, including over-the-counter medicines. Ask your health care provider about any side effects that may make you more likely to have a fall. Tell your health care provider if any medicines that you take make you feel dizzy or sleepy.  Osteoporosis screening. Osteoporosis is a condition that causes the bones to get weaker. This can make the bones weak and cause them to break more easily.  Blood pressure screening. Blood pressure changes and medicines to control blood pressure can make you feel dizzy.  Strength and balance checks. Your health care provider may recommend certain tests to check your strength and balance while standing, walking, or changing positions.  Foot health exam. Foot pain and numbness, as well as not wearing proper footwear, can make you more likely to have a fall.  Depression screening. You may be more likely to have a fall if you have a fear of falling, feel emotionally low, or feel unable to do activities that you used to do.  Alcohol use screening. Using too much alcohol can affect your balance and may make you more likely to   have a fall. What actions can I take to lower my risk of falls? General instructions  Talk with your health care provider about your risks for falling. Tell your health care provider if: ? You fall. Be sure to tell your health care provider about all falls, even ones that seem minor. ? You feel dizzy, sleepy, or off-balance.  Take over-the-counter and prescription medicines only as told by your health care provider. These include any  supplements.  Eat a healthy diet and maintain a healthy weight. A healthy diet includes low-fat dairy products, low-fat (lean) meats, and fiber from whole grains, beans, and lots of fruits and vegetables. Home safety  Remove any tripping hazards, such as rugs, cords, and clutter.  Install safety equipment such as grab bars in bathrooms and safety rails on stairs.  Keep rooms and walkways well-lit. Activity   Follow a regular exercise program to stay fit. This will help you maintain your balance. Ask your health care provider what types of exercise are appropriate for you.  If you need a cane or walker, use it as recommended by your health care provider.  Wear supportive shoes that have nonskid soles. Lifestyle  Do not drink alcohol if your health care provider tells you not to drink.  If you drink alcohol, limit how much you have: ? 0-1 drink a day for women. ? 0-2 drinks a day for men.  Be aware of how much alcohol is in your drink. In the [place] one drink equals one typical bottle of beer (12 oz), one-half glass of wine (5 oz), or one shot of hard liquor (1 oz).  Do not use any products that contain nicotine or tobacco, such as cigarettes and e-cigarettes. If you need help quitting, ask your health care provider. Summary  Having a healthy lifestyle and getting preventive care can help to protect your health and wellness after age 65.  Screening and testing are the best way to find a health problem early and help you avoid having a fall. Early diagnosis and treatment give you the best chance for managing medical conditions that are more common for people who are older than age 65.  Falls are a major cause of broken bones and head injuries in people who are older than age 65. Take precautions to prevent a fall at home.  Work with your health care provider to learn what changes you can make to improve your health and wellness and to prevent falls. This information is not intended  to replace advice given to you by your health care provider. Make sure you discuss any questions you have with your health care provider. Document Revised: 07/02/2018 Document Reviewed: 01/22/2017 Elsevier Patient Education  2020 [employer]  

## 2019-11-09 NOTE — Progress Notes (Signed)
Carly GrandchildPhyllis Moore 74 y.o.   Chief Complaint  Patient presents with  . Transitions Of Care    Patient states she is here for Transfer of Care and labwork. Patient states she has no other questions or concerns.    HISTORY OF PRESENT ILLNESS: This is a 74 y.o. female first office visit with me.  Used to see PA Benny LennertSarah Weber.  Has history of hypertension and diabetes.  Also has history of noncompliance with medications.  States she comes to the office every 6 months for checkups.  Has no complaints or medical concerns today.  Not vaccinated against Covid and has no intention of getting the vaccine. Not interested in colonoscopy.  HPI   Prior to Admission medications   Medication Sig Start Date End Date Taking? Authorizing Provider  Calcium-Magnesium-Vitamin D (CALCIUM MAGNESIUM PO) Take by mouth daily.   Yes [provider]  chlorthalidone (HYGROTON) 50 MG tablet Take 1 tablet (50 mg total) by mouth daily. 11/20/17  Yes Weber, Dema SeverinSarah L, PA-C  cholecalciferol (VITAMIN D3) 25 MCG (1000 UT) tablet Take 1,000 Units by mouth daily.   Yes [provider]  co-enzyme Q-10 30 MG capsule Take 30 mg by mouth 3 (three) times daily.     Yes [provider]  COMBIGAN 0.2-0.5 % ophthalmic solution INSTILL 1 DROP INTO EACH EYE TWICE DAILY 11/27/18  Yes [provider]  fish oil-omega-3 fatty acids 1000 MG capsule Take 2 g by mouth daily.     Yes [provider]  glucosamine-chondroitin 500-400 MG tablet Take 1 tablet by mouth 3 (three) times daily.     Yes [provider]  latanoprost (XALATAN) 0.005 % ophthalmic solution  02/18/15  Yes [provider]  losartan (COZAAR) 100 MG tablet Take 1 tablet (100 mg total) by mouth daily. 09/14/19 12/13/19 Yes Dyer Klug, Eilleen KempfMiguel Jose, MD  metFORMIN (GLUCOPHAGE) 1000 MG tablet Take 1 tablet (1,000 mg total) by mouth 2 (two) times daily with a meal. TAKE ONE TABLET BY MOUTH TWICE DAILY 09/14/19 12/13/19 Yes Andersson Larrabee,  Eilleen KempfMiguel Jose, MD  VITAMIN D, CHOLECALCIFEROL, PO Take by mouth daily.   Yes [provider]    Allergies  Allergen Reactions  . Sulfa Antibiotics Rash    Patient Active Problem List   Diagnosis Date Noted  . Vitamin D deficiency 06/11/2019  . Noncompliance with medications 06/11/2019  . Glaucoma   . HTN (hypertension)   . Diabetes type 2, uncontrolled (HCC)   . Hyperlipidemia     Past Medical History:  Diagnosis Date  . Diabetes mellitus without complication (HCC)    [facility] 09/13/2019  . Diabetes type 2, controlled (HCC)   . Glaucoma   . HTN (hypertension)   . Hyperlipidemia   . Hypertension    [employer] 09/13/2019    Past Surgical History:  Procedure Laterality Date  . EYE SURGERY N/A    [employer] 09/13/2019    Social History   Socioeconomic History  . Marital status: Single    Spouse name: Not on file  . Number of children: Not on file  . Years of education: Not on file  . Highest education level: Not on file  Occupational History  . Not on file  Tobacco Use  . Smoking status: Never Smoker  . Smokeless tobacco: Never Used  Substance and Sexual Activity  . Alcohol use: Yes  . Drug use: No  . Sexual activity: Not Currently  Other Topics Concern  . Not on file  Social History Narrative  Single   1 son   Social Determinants of Corporate investment banker Strain:   . Difficulty of Paying Living Expenses:   Food Insecurity:   . Worried About Programme researcher, broadcasting/film/video in the Last Year:   . Barista in the Last Year:   Transportation Needs:   . Freight forwarder (Medical):   Marland Kitchen Lack of Transportation (Non-Medical):   Physical Activity:   . Days of Exercise per Week:   . Minutes of Exercise per Session:   Stress:   . Feeling of Stress :   Social Connections:   . Frequency of Communication with Friends and Family:   . Frequency of Social Gatherings with Friends and Family:   . Attends Religious Services:   . Active Member of Clubs  or Organizations:   . Attends Banker Meetings:   Marland Kitchen Marital Status:   Intimate Partner Violence:   . Fear of Current or Ex-Partner:   . Emotionally Abused:   Marland Kitchen Physically Abused:   . Sexually Abused:     Family History  Problem Relation Age of Onset  . Heart disease Mother   . Cancer Father   . Diabetes Father   . Alcohol abuse Brother      Review of Systems  Constitutional: Negative.  Negative for chills and fever.  HENT: Negative.  Negative for congestion and sore throat.   Respiratory: Negative.  Negative for cough and shortness of breath.   Cardiovascular: Negative.  Negative for chest pain and palpitations.  Gastrointestinal: Negative.  Negative for abdominal pain, blood in stool, diarrhea, melena, nausea and vomiting.  Genitourinary: Negative for dysuria and hematuria.  Musculoskeletal: Negative.   Skin: Negative.  Negative for rash.  Neurological: Negative.  Negative for dizziness and headaches.  All other systems reviewed and are negative.   Today's Vitals   11/09/19 1350  BP: (!) 156/82  Pulse: 100  Resp: 18  Temp: 98.1 F (36.7 C)  TempSrc: Temporal  SpO2: 95%  Weight: 155 lb 3.2 oz (70.4 kg)  Height: 5\' 2"  (1.575 m)   Body mass index is 28.39 kg/m.  Physical Exam Vitals reviewed.  Constitutional:      Appearance: Normal appearance.  HENT:     Head: Normocephalic.  Eyes:     Extraocular Movements: Extraocular movements intact.     Conjunctiva/sclera: Conjunctivae normal.     Pupils: Pupils are equal, round, and reactive to light.  Neck:     Vascular: No carotid bruit.  Cardiovascular:     Rate and Rhythm: Normal rate and regular rhythm.     Pulses: Normal pulses.     Heart sounds: Normal heart sounds.  Pulmonary:     Effort: Pulmonary effort is normal.     Breath sounds: Normal breath sounds.  Musculoskeletal:        General: Normal range of motion.     Cervical back: Normal range of motion and neck supple.     Right lower  leg: No edema.     Left lower leg: No edema.  Lymphadenopathy:     Cervical: No cervical adenopathy.  Skin:    General: Skin is warm and dry.     Capillary Refill: Capillary refill takes less than 2 seconds.  Neurological:     General: No focal deficit present.     Mental Status: She is alert and oriented to person, place, and time.  Psychiatric:        Mood and  Affect: Mood normal.        Behavior: Behavior normal.      ASSESSMENT & PLAN: Clinically stable.  No medical concerns identified during this visit.  Continue present medications.  No changes. Follow-up in 6 months. Keir was seen today for transitions of care.  Diagnoses and all orders for this visit:  Hypertension associated with diabetes (HCC) -     Comprehensive metabolic panel -     Hemoglobin A1c  Essential hypertension -     losartan (COZAAR) 100 MG tablet; Take 1 tablet (100 mg total) by mouth daily. -     chlorthalidone (HYGROTON) 50 MG tablet; Take 1 tablet (50 mg total) by mouth daily.  Uncontrolled type 2 diabetes mellitus with hyperglycemia (HCC) -     metFORMIN (GLUCOPHAGE) 1000 MG tablet; Take 1 tablet (1,000 mg total) by mouth 2 (two) times daily with a meal. TAKE ONE TABLET BY MOUTH TWICE DAILY  Vitamin D deficiency -     VITAMIN D 25 Hydroxy (Vit-D Deficiency, Fractures)  Dyslipidemia -     Lipid panel  Encounter to establish care  History of glaucoma     Patient Instructions       If you have lab work done today you will be contacted with your lab results within the next 2 weeks.  If you have not heard from [place] then please contact us. The fastest way to get your results is to register for My Chart.   IF you received an x-ray today, you will receive an invoice from [facility]. Please contact [facility] at [phone] with questions or concerns regarding your invoice.   IF you received labwork today, you will receive an invoice from [facility]. Please contact  [facility] at [phone] with questions or concerns regarding your invoice.   Our billing staff will not be able to assist you with questions regarding bills from these companies.  You will be contacted with the lab results as soon as they are available. The fastest way to get your results is to activate your My Chart account. Instructions are located on the last page of this paperwork. If you have not heard from [place] regarding the results in 2 weeks, please contact this office.      Health Maintenance After Age 13 After age 16, you are at a higher risk for certain long-term diseases and infections as well as injuries from falls. Falls are a major cause of broken bones and head injuries in people who are older than age 74. Getting regular preventive care can help to keep you healthy and well. Preventive care includes getting regular testing and making lifestyle changes as recommended by your health care provider. Talk with your health care provider about:  Which screenings and tests you should have. A screening is a test that checks for a disease when you have no symptoms.  A diet and exercise plan that is right for you. What should I know about screenings and tests to prevent falls? Screening and testing are the best ways to find a health problem early. Early diagnosis and treatment give you the best chance of managing medical conditions that are common after age 68. Certain conditions and lifestyle choices may make you more likely to have a fall. Your health care provider may recommend:  Regular vision checks. Poor vision and conditions such as cataracts can make you more likely to have a fall. If you wear glasses, make sure to get your prescription updated if your vision  changes.  Medicine review. Work with your health care provider to regularly review all of the medicines you are taking, including over-the-counter medicines. Ask your health care provider about any side effects that may make  you more likely to have a fall. Tell your health care provider if any medicines that you take make you feel dizzy or sleepy.  Osteoporosis screening. Osteoporosis is a condition that causes the bones to get weaker. This can make the bones weak and cause them to break more easily.  Blood pressure screening. Blood pressure changes and medicines to control blood pressure can make you feel dizzy.  Strength and balance checks. Your health care provider may recommend certain tests to check your strength and balance while standing, walking, or changing positions.  Foot health exam. Foot pain and numbness, as well as not wearing proper footwear, can make you more likely to have a fall.  Depression screening. You may be more likely to have a fall if you have a fear of falling, feel emotionally low, or feel unable to do activities that you used to do.  Alcohol use screening. Using too much alcohol can affect your balance and may make you more likely to have a fall. What actions can I take to lower my risk of falls? General instructions  Talk with your health care provider about your risks for falling. Tell your health care provider if: ? You fall. Be sure to tell your health care provider about all falls, even ones that seem minor. ? You feel dizzy, sleepy, or off-balance.  Take over-the-counter and prescription medicines only as told by your health care provider. These include any supplements.  Eat a healthy diet and maintain a healthy weight. A healthy diet includes low-fat dairy products, low-fat (lean) meats, and fiber from whole grains, beans, and lots of fruits and vegetables. Home safety  Remove any tripping hazards, such as rugs, cords, and clutter.  Install safety equipment such as grab bars in bathrooms and safety rails on stairs.  Keep rooms and walkways well-lit. Activity   Follow a regular exercise program to stay fit. This will help you maintain your balance. Ask your health care  provider what types of exercise are appropriate for you.  If you need a cane or walker, use it as recommended by your health care provider.  Wear supportive shoes that have nonskid soles. Lifestyle  Do not drink alcohol if your health care provider tells you not to drink.  If you drink alcohol, limit how much you have: ? 0-1 drink a day for women. ? 0-2 drinks a day for men.  Be aware of how much alcohol is in your drink. In the [place] one drink equals one typical bottle of beer (12 oz), one-half glass of wine (5 oz), or one shot of hard liquor (1 oz).  Do not use any products that contain nicotine or tobacco, such as cigarettes and e-cigarettes. If you need help quitting, ask your health care provider. Summary  Having a healthy lifestyle and getting preventive care can help to protect your health and wellness after age 55.  Screening and testing are the best way to find a health problem early and help you avoid having a fall. Early diagnosis and treatment give you the best chance for managing medical conditions that are more common for people who are older than age 82.  Falls are a major cause of broken bones and head injuries in people who are older than age 31. Take precautions  to prevent a fall at home.  Work with your health care provider to learn what changes you can make to improve your health and wellness and to prevent falls. This information is not intended to replace advice given to you by your health care provider. Make sure you discuss any questions you have with your health care provider. Document Revised: 07/02/2018 Document Reviewed: 01/22/2017 Elsevier Patient Education  2020 Elsevier Inc.      Edwina Barth, MD [facility]

## 2019-11-10 ENCOUNTER — Other Ambulatory Visit: Payer: Self-pay | Admitting: Emergency Medicine

## 2019-11-10 DIAGNOSIS — E785 Hyperlipidemia, unspecified: Secondary | ICD-10-CM

## 2019-11-10 DIAGNOSIS — E1165 Type 2 diabetes mellitus with hyperglycemia: Secondary | ICD-10-CM

## 2019-11-10 LAB — COMPREHENSIVE METABOLIC PANEL
ALT: 40 IU/L — ABNORMAL HIGH (ref 0–32)
AST: 39 IU/L (ref 0–40)
Albumin/Globulin Ratio: 1.6 (ref 1.2–2.2)
Albumin: 4.2 g/dL (ref 3.7–4.7)
Alkaline Phosphatase: 106 IU/L (ref 48–121)
BUN/Creatinine Ratio: 10 — ABNORMAL LOW (ref 12–28)
BUN: 10 mg/dL (ref 8–27)
Bilirubin Total: 0.4 mg/dL (ref 0.0–1.2)
CO2: 20 mmol/L (ref 20–29)
Calcium: 9.6 mg/dL (ref 8.7–10.3)
Chloride: 101 mmol/L (ref 96–106)
Creatinine, Ser: 0.99 mg/dL (ref 0.57–1.00)
GFR calc Af Amer: 64 mL/min/{1.73_m2} (ref >59)
GFR calc non Af Amer: 56 mL/min/{1.73_m2} — ABNORMAL LOW (ref >59)
Globulin, Total: 2.6 g/dL (ref 1.5–4.5)
Glucose: 316 mg/dL — ABNORMAL HIGH (ref 65–99)
Potassium: 4 mmol/L (ref 3.5–5.2)
Sodium: 137 mmol/L (ref 134–144)
Total Protein: 6.8 g/dL (ref 6.0–8.5)

## 2019-11-10 LAB — HEMOGLOBIN A1C
Est. average glucose Bld gHb Est-mCnc: 286 mg/dL
Hgb A1c MFr Bld: 11.6 % — ABNORMAL HIGH (ref 4.8–5.6)

## 2019-11-10 LAB — LIPID PANEL
Chol/HDL Ratio: 5.5 ratio — ABNORMAL HIGH (ref 0.0–4.4)
Cholesterol, Total: 266 mg/dL — ABNORMAL HIGH (ref 100–199)
HDL: 48 mg/dL (ref >39)
LDL Chol Calc (NIH): 157 mg/dL — ABNORMAL HIGH (ref 0–99)
Triglycerides: 325 mg/dL — ABNORMAL HIGH (ref 0–149)
VLDL Cholesterol Cal: 61 mg/dL — ABNORMAL HIGH (ref 5–40)

## 2019-11-10 LAB — VITAMIN D 25 HYDROXY (VIT D DEFICIENCY, FRACTURES): Vit D, 25-Hydroxy: 24.2 ng/mL — ABNORMAL LOW (ref 30.0–100.0)

## 2019-11-10 MED ORDER — SITAGLIPTIN PHOSPHATE 100 MG PO TABS: 100.0000 mg | ORAL_TABLET | Freq: Every day | ORAL | 3 refills | Status: DC

## 2019-11-10 MED ORDER — ROSUVASTATIN CALCIUM 20 MG PO TABS: 20.0000 mg | ORAL_TABLET | Freq: Every day | ORAL | 3 refills | Status: DC

## 2019-11-10 NOTE — Progress Notes (Signed)
Okay, it's her decision. Thanks.

## 2019-12-29 DIAGNOSIS — R69 Illness, unspecified: Secondary | ICD-10-CM | POA: Diagnosis not present

## 2020-01-15 DIAGNOSIS — E119 Type 2 diabetes mellitus without complications: Secondary | ICD-10-CM | POA: Diagnosis not present

## 2020-03-07 DIAGNOSIS — R69 Illness, unspecified: Secondary | ICD-10-CM | POA: Diagnosis not present

## 2020-03-10 DIAGNOSIS — R69 Illness, unspecified: Secondary | ICD-10-CM | POA: Diagnosis not present

## 2020-03-21 ENCOUNTER — Telehealth: Payer: Self-pay | Admitting: Emergency Medicine

## 2020-03-21 NOTE — Telephone Encounter (Signed)
Pt called and is wanting to resch pts AWV changed from that date. Please advise.

## 2020-03-28 ENCOUNTER — Ambulatory Visit: Payer: Self-pay

## 2020-03-28 DIAGNOSIS — H26493 Other secondary cataract, bilateral: Secondary | ICD-10-CM | POA: Diagnosis not present

## 2020-03-28 DIAGNOSIS — Z961 Presence of intraocular lens: Secondary | ICD-10-CM | POA: Diagnosis not present

## 2020-03-28 DIAGNOSIS — H401131 Primary open-angle glaucoma, bilateral, mild stage: Secondary | ICD-10-CM | POA: Diagnosis not present

## 2020-03-28 DIAGNOSIS — E119 Type 2 diabetes mellitus without complications: Secondary | ICD-10-CM | POA: Diagnosis not present

## 2020-03-29 ENCOUNTER — Telehealth: Payer: Self-pay | Admitting: Emergency Medicine

## 2020-03-29 ENCOUNTER — Ambulatory Visit: Payer: Self-pay

## 2020-03-29 NOTE — Telephone Encounter (Signed)
patient would like a call back to reschedule her AWV

## 2020-04-04 ENCOUNTER — Ambulatory Visit: Payer: Self-pay

## 2020-04-17 DIAGNOSIS — E119 Type 2 diabetes mellitus without complications: Secondary | ICD-10-CM | POA: Diagnosis not present

## 2020-04-25 DIAGNOSIS — H401132 Primary open-angle glaucoma, bilateral, moderate stage: Secondary | ICD-10-CM | POA: Diagnosis not present

## 2020-05-09 ENCOUNTER — Ambulatory Visit: Payer: Medicare HMO | Admitting: Emergency Medicine

## 2020-05-11 ENCOUNTER — Ambulatory Visit: Payer: Self-pay | Admitting: Emergency Medicine

## 2020-06-15 ENCOUNTER — Telehealth: Payer: Self-pay | Admitting: Emergency Medicine

## 2020-06-20 ENCOUNTER — Ambulatory Visit: Payer: Self-pay | Admitting: Emergency Medicine

## 2020-07-17 DIAGNOSIS — E119 Type 2 diabetes mellitus without complications: Secondary | ICD-10-CM | POA: Diagnosis not present

## 2020-07-24 DIAGNOSIS — E119 Type 2 diabetes mellitus without complications: Secondary | ICD-10-CM | POA: Diagnosis not present

## 2020-07-24 DIAGNOSIS — Z8249 Family history of ischemic heart disease and other diseases of the circulatory system: Secondary | ICD-10-CM | POA: Diagnosis not present

## 2020-07-24 DIAGNOSIS — E785 Hyperlipidemia, unspecified: Secondary | ICD-10-CM | POA: Diagnosis not present

## 2020-07-24 DIAGNOSIS — Z7984 Long term (current) use of oral hypoglycemic drugs: Secondary | ICD-10-CM | POA: Diagnosis not present

## 2020-07-24 DIAGNOSIS — I10 Essential (primary) hypertension: Secondary | ICD-10-CM | POA: Diagnosis not present

## 2020-07-24 DIAGNOSIS — E669 Obesity, unspecified: Secondary | ICD-10-CM | POA: Diagnosis not present

## 2020-07-24 DIAGNOSIS — L409 Psoriasis, unspecified: Secondary | ICD-10-CM | POA: Diagnosis not present

## 2020-07-24 DIAGNOSIS — R32 Unspecified urinary incontinence: Secondary | ICD-10-CM | POA: Diagnosis not present

## 2020-07-24 DIAGNOSIS — H409 Unspecified glaucoma: Secondary | ICD-10-CM | POA: Diagnosis not present

## 2020-07-24 DIAGNOSIS — Z6830 Body mass index (BMI) 30.0-30.9, adult: Secondary | ICD-10-CM | POA: Diagnosis not present

## 2020-10-20 DIAGNOSIS — E119 Type 2 diabetes mellitus without complications: Secondary | ICD-10-CM | POA: Diagnosis not present

## 2020-11-14 DIAGNOSIS — H401132 Primary open-angle glaucoma, bilateral, moderate stage: Secondary | ICD-10-CM | POA: Diagnosis not present

## 2021-01-16 ENCOUNTER — Ambulatory Visit
Admission: RE | Admit: 2021-01-16 | Discharge: 2021-01-16 | Discharge status: Absent without leave | Payer: Medicare HMO | Source: Ambulatory Visit | Attending: Emergency Medicine | Admitting: Emergency Medicine

## 2021-01-16 ENCOUNTER — Telehealth: Payer: Self-pay | Admitting: Emergency Medicine

## 2021-01-16 DIAGNOSIS — I10 Essential (primary) hypertension: Secondary | ICD-10-CM

## 2021-01-16 MED ORDER — LOSARTAN POTASSIUM 100 MG PO TABS: 100.0000 mg | ORAL_TABLET | Freq: Every day | ORAL | 3 refills | Status: DC

## 2021-01-16 NOTE — Telephone Encounter (Signed)
Patient calling to request short fill of rx losartan (COZAAR) 100 MG tablet (Expired)  Patient's next appt is 01-30-2021  Pharmacy [facility] [address], [place] - [address]

## 2021-01-16 NOTE — Telephone Encounter (Signed)
Refilled losartan 100mg  to .

## 2021-01-30 ENCOUNTER — Ambulatory Visit: Payer: Medicare HMO | Admitting: Emergency Medicine

## 2021-02-07 ENCOUNTER — Encounter: Payer: Self-pay | Admitting: Emergency Medicine

## 2021-02-07 ENCOUNTER — Other Ambulatory Visit: Payer: Self-pay

## 2021-02-07 ENCOUNTER — Ambulatory Visit (INDEPENDENT_AMBULATORY_CARE_PROVIDER_SITE_OTHER): Payer: Medicare HMO | Admitting: Emergency Medicine

## 2021-02-07 VITALS — BP 136/86 | HR 95 | Ht 62.0 in | Wt 149.0 lb

## 2021-02-07 DIAGNOSIS — E1165 Type 2 diabetes mellitus with hyperglycemia: Secondary | ICD-10-CM | POA: Diagnosis not present

## 2021-02-07 DIAGNOSIS — I152 Hypertension secondary to endocrine disorders: Secondary | ICD-10-CM

## 2021-02-07 DIAGNOSIS — E1159 Type 2 diabetes mellitus with other circulatory complications: Secondary | ICD-10-CM

## 2021-02-07 LAB — POCT GLYCOSYLATED HEMOGLOBIN (HGB A1C): Hemoglobin A1C: 11.2 % — AB (ref 4.0–5.6)

## 2021-02-07 LAB — COMPREHENSIVE METABOLIC PANEL
ALT: 22 U/L (ref 0–35)
AST: 24 U/L (ref 0–37)
Albumin: 4.1 g/dL (ref 3.5–5.2)
Alkaline Phosphatase: 96 U/L (ref 39–117)
BUN: 13 mg/dL (ref 6–23)
CO2: 26 mEq/L (ref 19–32)
Calcium: 9.6 mg/dL (ref 8.4–10.5)
Chloride: 100 mEq/L (ref 96–112)
Creatinine, Ser: 1.04 mg/dL (ref 0.40–1.20)
GFR: 52.28 mL/min — ABNORMAL LOW (ref >60.00)
Glucose, Bld: 302 mg/dL — ABNORMAL HIGH (ref 70–99)
Potassium: 4 mEq/L (ref 3.5–5.1)
Sodium: 136 mEq/L (ref 135–145)
Total Bilirubin: 0.6 mg/dL (ref 0.2–1.2)
Total Protein: 7.3 g/dL (ref 6.0–8.3)

## 2021-02-07 LAB — LIPID PANEL
Cholesterol: 298 mg/dL — ABNORMAL HIGH (ref 0–200)
HDL: 56.5 mg/dL (ref >39.00)
NonHDL: 241.19
Total CHOL/HDL Ratio: 5
Triglycerides: 292 mg/dL — ABNORMAL HIGH (ref 0.0–149.0)
VLDL: 58.4 mg/dL — ABNORMAL HIGH (ref 0.0–40.0)

## 2021-02-07 LAB — LDL CHOLESTEROL, DIRECT: Direct LDL: 217 mg/dL

## 2021-02-07 MED ORDER — TRULICITY 0.75 MG/0.5ML ~~LOC~~ SOAJ: 0.7500 mg | SUBCUTANEOUS | 5 refills | Status: DC

## 2021-02-07 MED ORDER — DAPAGLIFLOZIN PROPANEDIOL 5 MG PO TABS: 5.0000 mg | ORAL_TABLET | Freq: Every day | ORAL | 3 refills | Status: AC

## 2021-02-07 NOTE — Assessment & Plan Note (Signed)
Well-controlled hypertension.  Continue losartan 100 mg and [place] 50 mg daily. Uncontrolled diabetes with hemoglobin A1c at 11.2 Lab Results  Component Value Date   HGBA1C 11.2 (A) 02/07/2021  Not taking metformin as prescribed.  Diet and nutrition discussed. Resistant to taking new medications but understands she should. We will start Trulicity 0.75 mg weekly.  Demonstration pen use with patient Will also start Farxiga 5 mg daily. Follow-up in 3 months. Does not want to take cholesterol medication due to reported side effects.

## 2021-02-07 NOTE — Progress Notes (Signed)
Carly Moore 75 y.o.   Chief Complaint  Patient presents with   Diabetes    BP and diabetic F/u    HISTORY OF PRESENT ILLNESS: This is a 75 y.o. female here for follow-up of diabetes and hypertension. Last office visit 11/09/2019 Uncontrolled diabetes.  Takes metformin once or twice a day only. Patient does not like taking medications. History of hypertension on losartan 100 mg and Hygroton 50 mg daily. Does not like taking cholesterol medication.  Not taking rosuvastatin. Does not want to take any vaccines as recommended. No longer doing screening with mammograms or colonoscopies. Lab Results  Component Value Date   HGBA1C 11.6 (H) 11/09/2019   BP Readings from Last 3 Encounters:  02/07/21 136/86  11/09/19 (!) 156/82  05/12/19 (!) 170/70     Diabetes Pertinent negatives for hypoglycemia include no dizziness or headaches. Pertinent negatives for diabetes include no chest pain.    Prior to Admission medications   Medication Sig Start Date End Date Taking? Authorizing Provider  Calcium-Magnesium-Vitamin D (CALCIUM MAGNESIUM PO) Take by mouth daily.   Yes [provider]  chlorthalidone (HYGROTON) 50 MG tablet Take 1 tablet (50 mg total) by mouth daily. 11/09/19  Yes Marin Wisner, Eilleen Kempf, MD  cholecalciferol (VITAMIN D3) 25 MCG (1000 UT) tablet Take 1,000 Units by mouth daily.   Yes [provider]  co-enzyme Q-10 30 MG capsule Take 30 mg by mouth 3 (three) times daily.     Yes [provider]  COMBIGAN 0.2-0.5 % ophthalmic solution INSTILL 1 DROP INTO EACH EYE TWICE DAILY 11/27/18  Yes [provider]  fish oil-omega-3 fatty acids 1000 MG capsule Take 2 g by mouth daily.     Yes [provider]  glucosamine-chondroitin 500-400 MG tablet Take 1 tablet by mouth 3 (three) times daily.     Yes [provider]  latanoprost (XALATAN) 0.005 % ophthalmic solution  02/18/15  Yes [provider]  losartan (COZAAR) 100 MG  tablet Take 1 tablet (100 mg total) by mouth daily. 01/16/21 04/16/21 Yes Jaleen Grupp, Eilleen Kempf, MD  rosuvastatin (CRESTOR) 20 MG tablet Take 1 tablet (20 mg total) by mouth daily. 11/10/19  Yes Fawaz Borquez, Eilleen Kempf, MD  VITAMIN D, CHOLECALCIFEROL, PO Take by mouth daily.   Yes [provider]  metFORMIN (GLUCOPHAGE) 1000 MG tablet Take 1 tablet (1,000 mg total) by mouth 2 (two) times daily with a meal. TAKE ONE TABLET BY MOUTH TWICE DAILY 11/09/19 02/07/20  Georgina Quint, MD  sitaGLIPtin (JANUVIA) 100 MG tablet Take 1 tablet (100 mg total) by mouth daily. 11/10/19 02/08/20  Georgina Quint, MD    Allergies  Allergen Reactions   Sulfa Antibiotics Rash    Patient Active Problem List   Diagnosis Date Noted   History of glaucoma 11/09/2019   Vitamin D deficiency 06/11/2019   Noncompliance with medications 06/11/2019   Glaucoma    Hypertension associated with diabetes (HCC)    Diabetes type 2, uncontrolled    Dyslipidemia     Past Medical History:  Diagnosis Date   Diabetes mellitus without complication (HCC)    [employer] 09/13/2019   Diabetes type 2, controlled (HCC)    Glaucoma    HTN (hypertension)    Hyperlipidemia    Hypertension    [employer] 09/13/2019    Past Surgical History:  Procedure Laterality Date   EYE SURGERY N/A    [employer] 09/13/2019    Social History   Socioeconomic History   Marital status: Single  Spouse name: Not on file   Number of children: Not on file   Years of education: Not on file   Highest education level: Not on file  Occupational History   Not on file  Tobacco Use   Smoking status: Never   Smokeless tobacco: Never  Substance and Sexual Activity   Alcohol use: Yes   Drug use: No   Sexual activity: Not Currently  Other Topics Concern   Not on file  Social History Narrative   Single   1 son   Social Determinants of Health   Financial Resource Strain: Not on file  Food Insecurity: Not on file   Transportation Needs: Not on file  Physical Activity: Not on file  Stress: Not on file  Social Connections: Not on file  Intimate Partner Violence: Not on file    Family History  Problem Relation Age of Onset   Heart disease Mother    Cancer Father    Diabetes Father    Alcohol abuse Brother      Review of Systems  Constitutional: Negative.  Negative for chills and fever.  HENT: Negative.  Negative for congestion and sore throat.   Respiratory: Negative.  Negative for cough and shortness of breath.   Cardiovascular: Negative.  Negative for chest pain and palpitations.  Gastrointestinal:  Negative for abdominal pain, diarrhea, nausea and vomiting.  Genitourinary: Negative.   Skin: Negative.  Negative for rash.  Neurological:  Negative for dizziness and headaches.  All other systems reviewed and are negative.  Vitals:   02/07/21 1029  BP: 136/86  Pulse: 95  SpO2: 94%   Wt Readings from Last 3 Encounters:  02/07/21 149 lb (67.6 kg)  11/09/19 155 lb 3.2 oz (70.4 kg)  05/12/19 158 lb 3.2 oz (71.8 kg)    Physical Exam Vitals reviewed.  Constitutional:      Appearance: Normal appearance.  HENT:     Head: Normocephalic.  Eyes:     Extraocular Movements: Extraocular movements intact.     Pupils: Pupils are equal, round, and reactive to light.  Cardiovascular:     Rate and Rhythm: Normal rate and regular rhythm.     Pulses: Normal pulses.     Heart sounds: Normal heart sounds.  Pulmonary:     Effort: Pulmonary effort is normal.     Breath sounds: Normal breath sounds.  Musculoskeletal:     Cervical back: No tenderness.  Skin:    General: Skin is warm and dry.     Capillary Refill: Capillary refill takes less than 2 seconds.  Neurological:     General: No focal deficit present.     Mental Status: She is alert and oriented to person, place, and time.  Psychiatric:        Mood and Affect: Mood normal.        Behavior: Behavior normal.    Results for orders  placed or performed in visit on 02/07/21 (from the past 24 hour(s))  POCT glycosylated hemoglobin (Hb A1C)     Status: Abnormal   Collection Time: 02/07/21 10:34 AM  Result Value Ref Range   Hemoglobin A1C 11.2 (A) 4.0 - 5.6 %   HbA1c POC (<> result, manual entry)     HbA1c, POC (prediabetic range)     HbA1c, POC (controlled diabetic range)      ASSESSMENT & PLAN: Problem List Items Addressed This Visit       Cardiovascular and Mediastinum   Hypertension associated with diabetes (HCC) -  Primary    Well-controlled hypertension.  Continue losartan 100 mg and Hygroton 50 mg daily. Uncontrolled diabetes with hemoglobin A1c at 11.2 Lab Results  Component Value Date   HGBA1C 11.2 (A) 02/07/2021  Not taking metformin as prescribed.  Diet and nutrition discussed. Resistant to taking new medications but understands she should. We will start Trulicity 0.75 mg weekly.  Demonstration pen use with patient Will also start Farxiga 5 mg daily. Follow-up in 3 months. Does not want to take cholesterol medication due to reported side effects.      Relevant Medications   dapagliflozin propanediol (FARXIGA) 5 MG TABS tablet   Dulaglutide (TRULICITY) 0.75 MG/0.5ML SOPN   Other Relevant Orders   POCT glycosylated hemoglobin (Hb A1C) (Completed)   Comprehensive metabolic panel   Lipid panel     Endocrine   Diabetes type 2, uncontrolled   Relevant Medications   dapagliflozin propanediol (FARXIGA) 5 MG TABS tablet   Dulaglutide (TRULICITY) 0.75 MG/0.5ML SOPN   Patient Instructions  Diabetes Mellitus and Nutrition, Adult When you have diabetes, or diabetes mellitus, it is very important to have healthy eating habits because your blood sugar (glucose) levels are greatly affected by what you eat and drink. Eating healthy foods in the right amounts, at about the same times every day, can help you: Manage your blood glucose. Lower your risk of heart disease. Improve your blood pressure. Reach or  maintain a healthy weight. What can affect my meal plan? Every person with diabetes is different, and each person has different needs for a meal plan. Your health care provider may recommend that you work with a dietitian to make a meal plan that is best for you. Your meal plan may vary depending on factors such as: The calories you need. The medicines you take. Your weight. Your blood glucose, blood pressure, and cholesterol levels. Your activity level. Other health conditions you have, such as heart or kidney disease. How do carbohydrates affect me? Carbohydrates, also called carbs, affect your blood glucose level more than any other type of food. Eating carbs raises the amount of glucose in your blood. It is important to know how many carbs you can safely have in each meal. This is different for every person. Your dietitian can help you calculate how many carbs you should have at each meal and for each snack. How does alcohol affect me? Alcohol can cause a decrease in blood glucose (hypoglycemia), especially if you use insulin or take certain diabetes medicines by mouth. Hypoglycemia can be a life-threatening condition. Symptoms of hypoglycemia, such as sleepiness, dizziness, and confusion, are similar to symptoms of having too much alcohol. Do not drink alcohol if: Your health care provider tells you not to drink. You are pregnant, may be pregnant, or are planning to become pregnant. If you drink alcohol: Limit how much you have to: 0-1 drink a day for women. 0-2 drinks a day for men. Know how much alcohol is in your drink. In the [place] one drink equals one 12 oz bottle of beer (355 mL), one 5 oz glass of wine (148 mL), or one 1 oz glass of hard liquor (44 mL). Keep yourself hydrated with water, diet soda, or unsweetened iced tea. Keep in mind that regular soda, juice, and other mixers may contain a lot of sugar and must be counted as carbs. What are tips for following this plan? Reading  food labels Start by checking the serving size on the Nutrition Facts label of packaged foods and  drinks. The number of calories and the amount of carbs, fats, and other nutrients listed on the label are based on one serving of the item. Many items contain more than one serving per package. Check the total grams (g) of carbs in one serving. Check the number of grams of saturated fats and trans fats in one serving. Choose foods that have a low amount or none of these fats. Check the number of milligrams (mg) of salt (sodium) in one serving. Most people should limit total sodium intake to less than 2,300 mg per day. Always check the nutrition information of foods labeled as "low-fat" or "nonfat." These foods may be higher in added sugar or refined carbs and should be avoided. Talk to your dietitian to identify your daily goals for nutrients listed on the label. Shopping Avoid buying canned, pre-made, or processed foods. These foods tend to be high in fat, sodium, and added sugar. Shop around the outside edge of the grocery store. This is where you will most often find fresh fruits and vegetables, bulk grains, fresh meats, and fresh dairy products. Cooking Use low-heat cooking methods, such as baking, instead of high-heat cooking methods, such as deep frying. Cook using healthy oils, such as olive, canola, or sunflower oil. Avoid cooking with butter, cream, or high-fat meats. Meal planning Eat meals and snacks regularly, preferably at the same times every day. Avoid going long periods of time without eating. Eat foods that are high in fiber, such as fresh fruits, vegetables, beans, and whole grains. Eat 4-6 oz (112-168 g) of lean protein each day, such as lean meat, chicken, fish, eggs, or tofu. One ounce (oz) (28 g) of lean protein is equal to: 1 oz (28 g) of meat, chicken, or fish. 1 egg.  cup (62 g) of tofu. Eat some foods each day that contain healthy fats, such as avocado, nuts, seeds, and  fish. What foods should I eat? Fruits Berries. Apples. Oranges. Peaches. Apricots. Plums. Grapes. Mangoes. Papayas. Pomegranates. Kiwi. Cherries. Vegetables Leafy greens, including lettuce, spinach, kale, chard, collard greens, mustard greens, and cabbage. Beets. Cauliflower. Broccoli. Carrots. Green beans. Tomatoes. Peppers. Onions. Cucumbers. Brussels sprouts. Grains Whole grains, such as whole-wheat or whole-grain bread, crackers, tortillas, cereal, and pasta. Unsweetened oatmeal. Quinoa. Brown or wild rice. Meats and other proteins Seafood. Poultry without skin. Lean cuts of poultry and beef. Tofu. Nuts. Seeds. Dairy Low-fat or fat-free dairy products such as milk, yogurt, and cheese. The items listed above may not be a complete list of foods and beverages you can eat and drink. Contact a dietitian for more information. What foods should I avoid? Fruits Fruits canned with syrup. Vegetables Canned vegetables. Frozen vegetables with butter or cream sauce. Grains Refined white flour and flour products such as bread, pasta, snack foods, and cereals. Avoid all processed foods. Meats and other proteins Fatty cuts of meat. Poultry with skin. Breaded or fried meats. Processed meat. Avoid saturated fats. Dairy Full-fat yogurt, cheese, or milk. Beverages Sweetened drinks, such as soda or iced tea. The items listed above may not be a complete list of foods and beverages you should avoid. Contact a dietitian for more information. Questions to ask a health care provider Do I need to meet with a certified diabetes care and education specialist? Do I need to meet with a dietitian? What number can I call if I have questions? When are the best times to check my blood glucose? Where to find more information: American Diabetes Association: diabetes.org Academy of  Nutrition and Dietetics: eatright.[employer] of Diabetes and Digestive and Kidney Diseases: StageSync.si Association of  Diabetes Care & Education Specialists: diabeteseducator.org Summary It is important to have healthy eating habits because your blood sugar (glucose) levels are greatly affected by what you eat and drink. It is important to use alcohol carefully. A healthy meal plan will help you manage your blood glucose and lower your risk of heart disease. Your health care provider may recommend that you work with a dietitian to make a meal plan that is best for you. This information is not intended to replace advice given to you by your health care provider. Make sure you discuss any questions you have with your health care provider. Document Revised: 10/13/2019 Document Reviewed: 10/13/2019 Elsevier Patient Education  2022 Elsevier Inc.    Edwina Barth, MD [facility]

## 2021-02-07 NOTE — Patient Instructions (Signed)

## 2021-02-09 ENCOUNTER — Telehealth: Payer: Self-pay | Admitting: Emergency Medicine

## 2021-02-09 NOTE — Telephone Encounter (Signed)
Patient requesting a call back to discuss rx dapagliflozin propanediol (FARXIGA) 5 MG TABS tablet  Patient states she does not remember discussing medication w/ provider and is requesting more information on it

## 2021-02-09 NOTE — Telephone Encounter (Signed)
Spoke with patient and she states that starting the Marcelline Deist was not discussed during her visit. Advised patient, that per OV note, she is to start both the [place] and the Trulicity. Patient states that she is only going to take the trulicity because the farxiga costs too much. Routing to patient's PCP

## 2021-02-10 NOTE — Telephone Encounter (Signed)
Acknowledged.  We did talk about both medications but she does not like taking medications.  Must continue metformin twice a day and Trulicity once a week.  I am however afraid this will not be enough for her.  Follow-up with me as scheduled.  Thanks.

## 2021-05-03 NOTE — Telephone Encounter (Signed)
Erroneous encounter. Please disregard.

## 2021-05-22 DIAGNOSIS — H401132 Primary open-angle glaucoma, bilateral, moderate stage: Secondary | ICD-10-CM | POA: Diagnosis not present

## 2021-05-22 DIAGNOSIS — H5213 Myopia, bilateral: Secondary | ICD-10-CM | POA: Diagnosis not present

## 2021-08-01 ENCOUNTER — Telehealth: Payer: Self-pay | Admitting: Emergency Medicine

## 2021-08-01 NOTE — Telephone Encounter (Signed)
LVM for pt to rtn my call to schedule AWV with [facility]. Please schedule if pt calls the office.  ?

## 2021-08-13 DIAGNOSIS — H52223 Regular astigmatism, bilateral: Secondary | ICD-10-CM | POA: Diagnosis not present

## 2021-08-13 DIAGNOSIS — Z961 Presence of intraocular lens: Secondary | ICD-10-CM | POA: Diagnosis not present

## 2021-08-13 DIAGNOSIS — H5213 Myopia, bilateral: Secondary | ICD-10-CM | POA: Diagnosis not present

## 2021-11-23 ENCOUNTER — Ambulatory Visit: Payer: Self-pay

## 2021-11-23 NOTE — Patient Outreach (Signed)
  Care Coordination   11/23/2021 Name: Carly Moore MRN: [id number] DOB: December 08, 1945   Care Coordination Outreach Attempts:  An unsuccessful telephone outreach was attempted today to offer the patient information about available care coordination services as a benefit of their health plan.   Follow Up Plan:  Additional outreach attempts will be made to offer the patient care coordination information and services.   Encounter Outcome:  No Answer  Care Coordination Interventions Activated:  No   Care Coordination Interventions:  No, not indicated    Kathyrn Sheriff, RN, MSN, BSN, CCM Care Coordinator [phone]

## 2021-11-29 ENCOUNTER — Telehealth: Payer: Self-pay

## 2021-11-29 NOTE — Patient Outreach (Signed)
  Care Coordination   11/29/2021 Name: Carly Moore MRN: [id number] DOB: 07/24/1945   Care Coordination Outreach Attempts:  A second unsuccessful outreach was attempted today to offer the patient with information about available care coordination services as a benefit of their health plan.     Follow Up Plan:  Additional outreach attempts will be made to offer the patient care coordination information and services.   Encounter Outcome:  No Answer  Care Coordination Interventions Activated:  No   Care Coordination Interventions:  No, not indicated    Kathyrn Sheriff, RN, MSN, BSN, CCM Care Coordinator [phone]

## 2021-12-05 ENCOUNTER — Telehealth: Payer: Self-pay

## 2021-12-05 NOTE — Patient Outreach (Signed)
  Care Coordination   12/05/2021 Name: Blaze Sandin MRN: [id number] DOB: 1946/03/16   Care Coordination Outreach Attempts:  A third unsuccessful outreach was attempted today to offer the patient with information about available care coordination services as a benefit of their health plan.   Follow Up Plan:  No further outreach attempts will be made at this time. We have been unable to contact the patient to offer or enroll patient in care coordination services  Encounter Outcome:  No Answer  Care Coordination Interventions Activated:  No   Care Coordination Interventions:  No, not indicated    Kathyrn Sheriff, RN, MSN, BSN, CCM [facility] Care Coordinator [phone]

## 2022-01-15 ENCOUNTER — Ambulatory Visit (INDEPENDENT_AMBULATORY_CARE_PROVIDER_SITE_OTHER): Payer: Medicare HMO | Admitting: Emergency Medicine

## 2022-01-15 ENCOUNTER — Encounter: Payer: Self-pay | Admitting: Emergency Medicine

## 2022-01-15 VITALS — BP 130/70 | HR 81 | Temp 98.2°F | Ht 62.0 in | Wt 145.0 lb

## 2022-01-15 DIAGNOSIS — E1169 Type 2 diabetes mellitus with other specified complication: Secondary | ICD-10-CM | POA: Diagnosis not present

## 2022-01-15 DIAGNOSIS — E785 Hyperlipidemia, unspecified: Secondary | ICD-10-CM

## 2022-01-15 DIAGNOSIS — R300 Dysuria: Secondary | ICD-10-CM

## 2022-01-15 DIAGNOSIS — N1831 Chronic kidney disease, stage 3a: Secondary | ICD-10-CM | POA: Diagnosis not present

## 2022-01-15 DIAGNOSIS — I10 Essential (primary) hypertension: Secondary | ICD-10-CM

## 2022-01-15 DIAGNOSIS — I152 Hypertension secondary to endocrine disorders: Secondary | ICD-10-CM | POA: Diagnosis not present

## 2022-01-15 DIAGNOSIS — E1165 Type 2 diabetes mellitus with hyperglycemia: Secondary | ICD-10-CM

## 2022-01-15 DIAGNOSIS — Z91148 Patient's other noncompliance with medication regimen for other reason: Secondary | ICD-10-CM | POA: Diagnosis not present

## 2022-01-15 DIAGNOSIS — E1159 Type 2 diabetes mellitus with other circulatory complications: Secondary | ICD-10-CM | POA: Diagnosis not present

## 2022-01-15 LAB — CBC WITH DIFFERENTIAL/PLATELET
Basophils Absolute: 0.1 10*3/uL (ref 0.0–0.1)
Basophils Relative: 0.7 % (ref 0.0–3.0)
Eosinophils Absolute: 0.3 10*3/uL (ref 0.0–0.7)
Eosinophils Relative: 3.1 % (ref 0.0–5.0)
HCT: 41.7 % (ref 36.0–46.0)
Hemoglobin: 14.1 g/dL (ref 12.0–15.0)
Lymphocytes Relative: 37.1 % (ref 12.0–46.0)
Lymphs Abs: 3.1 10*3/uL (ref 0.7–4.0)
MCHC: 33.9 g/dL (ref 30.0–36.0)
MCV: 92 fl (ref 78.0–100.0)
Monocytes Absolute: 0.5 10*3/uL (ref 0.1–1.0)
Monocytes Relative: 6.4 % (ref 3.0–12.0)
Neutro Abs: 4.4 10*3/uL (ref 1.4–7.7)
Neutrophils Relative %: 52.7 % (ref 43.0–77.0)
Platelets: 287 10*3/uL (ref 150.0–400.0)
RBC: 4.54 Mil/uL (ref 3.87–5.11)
RDW: 13.2 % (ref 11.5–15.5)
WBC: 8.4 10*3/uL (ref 4.0–10.5)

## 2022-01-15 LAB — COMPREHENSIVE METABOLIC PANEL
ALT: 16 U/L (ref 0–35)
AST: 18 U/L (ref 0–37)
Albumin: 3.9 g/dL (ref 3.5–5.2)
Alkaline Phosphatase: 88 U/L (ref 39–117)
BUN: 9 mg/dL (ref 6–23)
CO2: 27 mEq/L (ref 19–32)
Calcium: 9.6 mg/dL (ref 8.4–10.5)
Chloride: 100 mEq/L (ref 96–112)
Creatinine, Ser: 1.01 mg/dL (ref 0.40–1.20)
GFR: 53.79 mL/min — ABNORMAL LOW (ref >60.00)
Glucose, Bld: 267 mg/dL — ABNORMAL HIGH (ref 70–99)
Potassium: 3.9 mEq/L (ref 3.5–5.1)
Sodium: 135 mEq/L (ref 135–145)
Total Bilirubin: 0.5 mg/dL (ref 0.2–1.2)
Total Protein: 7.1 g/dL (ref 6.0–8.3)

## 2022-01-15 LAB — MICROALBUMIN / CREATININE URINE RATIO
Creatinine,U: 68.6 mg/dL
Microalb Creat Ratio: 15.7 mg/g (ref 0.0–30.0)
Microalb, Ur: 10.8 mg/dL — ABNORMAL HIGH (ref 0.0–1.9)

## 2022-01-15 LAB — LIPID PANEL
Cholesterol: 239 mg/dL — ABNORMAL HIGH (ref 0–200)
HDL: 52.8 mg/dL (ref >39.00)
NonHDL: 186.48
Total CHOL/HDL Ratio: 5
Triglycerides: 249 mg/dL — ABNORMAL HIGH (ref 0.0–149.0)
VLDL: 49.8 mg/dL — ABNORMAL HIGH (ref 0.0–40.0)

## 2022-01-15 LAB — LDL CHOLESTEROL, DIRECT: Direct LDL: 175 mg/dL

## 2022-01-15 LAB — POCT GLYCOSYLATED HEMOGLOBIN (HGB A1C): Hemoglobin A1C: 10.1 % — AB (ref 4.0–5.6)

## 2022-01-15 MED ORDER — METFORMIN HCL 1000 MG PO TABS: 1000.0000 mg | ORAL_TABLET | Freq: Two times a day (BID) | ORAL | 3 refills | Status: DC

## 2022-01-15 MED ORDER — LOSARTAN POTASSIUM 100 MG PO TABS: 100.0000 mg | ORAL_TABLET | Freq: Every day | ORAL | 3 refills | Status: DC

## 2022-01-15 MED ORDER — EMPAGLIFLOZIN 10 MG PO TABS: 10.0000 mg | ORAL_TABLET | Freq: Every day | ORAL | 3 refills | Status: DC

## 2022-01-15 NOTE — Assessment & Plan Note (Signed)
Advised to stay well-hydrated and avoid NSAIDs. We will start Jardiance 10 mg daily for diabetes and kidney protection.

## 2022-01-15 NOTE — Assessment & Plan Note (Addendum)
Elevated blood pressure reading in the office but normal at home. Continue losartan 100 mg daily. Uncontrolled diabetes with hemoglobin A1c 1C at 10.1 Lab Results  Component Value Date   HGBA1C 10.1 (A) 01/15/2022  Recommend to continue metformin 1000 mg twice a day and start Jardiance 10 mg daily Recommend to restart weekly Trulicity 1.27 mg Cardiovascular risk associated with hypertension and diabetes discussed Diet and nutrition discussed. Follow-up in 3 months.

## 2022-01-15 NOTE — Assessment & Plan Note (Signed)
Urinalysis and urine culture done today. May need antibiotics.

## 2022-01-15 NOTE — Assessment & Plan Note (Signed)
Abnormal lipid profile. Reluctant to take statin medications. Cardiovascular risks associated with dyslipidemia discussed. Diet and nutrition discussed.

## 2022-01-15 NOTE — Progress Notes (Signed)
Carly Moore 76 y.o.   Chief Complaint  Patient presents with   Follow-up    55mnth f/u appt, patient thinks she has a poss UTI, not taking any antibiotics, doing natural remedies at home.   Dysuria    HISTORY OF PRESENT ILLNESS: This is a 76 y.o. female complaining of urinary frequency and burning for the last 3 to 4 days History of uncontrolled diabetes.  Only taking metformin. Last office visit with me November 2022.  Did not follow-up as recommended History hypertension but not taking medication at present time. Wilder Glade was too expensive.  Took Trulicity for several days but did not like side effects.  Did not try long enough. Difficult summer with passing of her brother. Trying to eat better No other complaints or medical concerns today  Dysuria  Associated symptoms include frequency. Pertinent negatives include no chills, nausea or vomiting.     Prior to Admission medications   Medication Sig Start Date End Date Taking? Authorizing Provider  Calcium-Magnesium-Vitamin D (CALCIUM MAGNESIUM PO) Take by mouth daily.   Yes [provider]  co-enzyme Q-10 30 MG capsule Take 30 mg by mouth 3 (three) times daily.     Yes [provider]  Dulaglutide (TRULICITY) A999333 0000000 SOPN Inject 0.75 mg into the skin once a week. 02/07/21  Yes Moet Mikulski, Ines Bloomer, MD  fish oil-omega-3 fatty acids 1000 MG capsule Take 2 g by mouth daily.     Yes [provider]  glucosamine-chondroitin 500-400 MG tablet Take 1 tablet by mouth 3 (three) times daily.     Yes [provider]  latanoprost (XALATAN) 0.005 % ophthalmic solution  02/18/15  Yes [provider]  VITAMIN D, CHOLECALCIFEROL, PO Take by mouth daily.   Yes [provider]  losartan (COZAAR) 100 MG tablet Take 1 tablet (100 mg total) by mouth daily. 01/16/21 04/16/21  Horald Pollen, MD  metFORMIN (GLUCOPHAGE) 1000 MG tablet Take 1 tablet (1,000 mg total) by mouth 2 (two) times  daily with a meal. TAKE ONE TABLET BY MOUTH TWICE DAILY 11/09/19 02/07/20  Horald Pollen, MD    Allergies  Allergen Reactions   Sulfa Antibiotics Rash    Patient Active Problem List   Diagnosis Date Noted   Dyslipidemia associated with type 2 diabetes mellitus ([place]) 01/15/2022   Stage 3a chronic kidney disease ([place]) 01/15/2022   History of glaucoma 11/09/2019   Vitamin D deficiency 06/11/2019   Noncompliance with medications 06/11/2019   Glaucoma    Hypertension associated with diabetes ([place])    Diabetes type 2, uncontrolled    Dyslipidemia     Past Medical History:  Diagnosis Date   Diabetes mellitus without complication ([place])    [employer]reesia 09/13/2019   Diabetes type 2, controlled ([place])    Glaucoma    HTN (hypertension)    Hyperlipidemia    Hypertension    [employer]reesia 09/13/2019    Past Surgical History:  Procedure Laterality Date   EYE SURGERY N/A    [employer]reesia 09/13/2019    Social History   Socioeconomic History   Marital status: Single    Spouse name: Not on file   Number of children: Not on file   Years of education: Not on file   Highest education level: Not on file  Occupational History   Not on file  Tobacco Use   Smoking status: Never   Smokeless tobacco: Never  Substance and Sexual Activity   Alcohol use: Yes   Drug use: No  Sexual activity: Not Currently  Other Topics Concern   Not on file  Social History Narrative   Single   1 son   Social Determinants of Health   Financial Resource Strain: Not on file  Food Insecurity: Not on file  Transportation Needs: Not on file  Physical Activity: Not on file  Stress: Not on file  Social Connections: Not on file  Intimate Partner Violence: Not on file    Family History  Problem Relation Age of Onset   Heart disease Mother    Cancer Father    Diabetes Father    Alcohol abuse Brother      Review of Systems  Constitutional: Negative.  Negative for chills and fever.  HENT: Negative.   Negative for congestion and sore throat.   Respiratory: Negative.  Negative for cough and shortness of breath.   Cardiovascular: Negative.  Negative for chest pain and palpitations.  Gastrointestinal:  Negative for abdominal pain, nausea and vomiting.  Genitourinary:  Positive for dysuria and frequency.  Musculoskeletal: Negative.   Skin: Negative.  Negative for rash.  Neurological:  Negative for dizziness and headaches.  All other systems reviewed and are negative.  Today's Vitals   01/15/22 1003 01/15/22 1033  BP: (!) 142/92 130/70  Pulse: 81   Temp: 98.2 F (36.8 C)   TempSrc: Oral   SpO2: 97%   Weight: 145 lb (65.8 kg)   Height: 5\' 2"  (1.575 m)    Body mass index is 26.52 kg/m. Wt Readings from Last 3 Encounters:  01/15/22 145 lb (65.8 kg)  02/07/21 149 lb (67.6 kg)  11/09/19 155 lb 3.2 oz (70.4 kg)     Physical Exam Vitals reviewed.  Constitutional:      Appearance: Normal appearance.  HENT:     Head: Normocephalic.     Mouth/Throat:     Mouth: Mucous membranes are moist.     Pharynx: Oropharynx is clear.  Eyes:     Extraocular Movements: Extraocular movements intact.     Pupils: Pupils are equal, round, and reactive to light.  Cardiovascular:     Rate and Rhythm: Normal rate and regular rhythm.     Pulses: Normal pulses.     Heart sounds: Normal heart sounds.  Pulmonary:     Effort: Pulmonary effort is normal.     Breath sounds: Normal breath sounds.  Skin:    General: Skin is warm.  Neurological:     General: No focal deficit present.     Mental Status: She is alert and oriented to person, place, and time.  Psychiatric:        Mood and Affect: Mood normal.        Behavior: Behavior normal.     Results for orders placed or performed in visit on 01/15/22 (from the past 24 hour(s))  POCT HgB A1C     Status: Abnormal   Collection Time: 01/15/22 10:21 AM  Result Value Ref Range   Hemoglobin A1C 10.1 (A) 4.0 - 5.6 %   HbA1c POC (<> result, manual  entry)     HbA1c, POC (prediabetic range)     HbA1c, POC (controlled diabetic range)      ASSESSMENT & PLAN: A total of 43 minutes was spent with the patient and counseling/coordination of care regarding preparing for this visit, review of most recent office visit notes, review of most recent blood work results including interpretation of today's hemoglobin A1c, review of multiple chronic medical problems and their management, cardiovascular risks  associated with uncontrolled diabetes, education on nutrition, review of all medications and changes made, prognosis, documentation, and need for follow-up.  Problem List Items Addressed This Visit       Cardiovascular and Mediastinum   Hypertension associated with diabetes ([place]) - Primary    Elevated blood pressure reading in the office but normal at home. Continue losartan 100 mg daily. Uncontrolled diabetes with hemoglobin A1c 1C at 10.1 Lab Results  Component Value Date   HGBA1C 10.1 (A) 01/15/2022  Recommend to continue metformin 1000 mg twice a day and start Jardiance 10 mg daily Recommend to restart weekly Trulicity [id number] mg Cardiovascular risk associated with hypertension and diabetes discussed Diet and nutrition discussed. Follow-up in 3 months.       Relevant Medications   losartan (COZAAR) 100 MG tablet   metFORMIN (GLUCOPHAGE) 1000 MG tablet   empagliflozin (JARDIANCE) 10 MG TABS tablet   Other Relevant Orders   Urine Microalbumin w/creat. ratio   CBC with Differential/Platelet   Comprehensive metabolic panel     Endocrine   Diabetes type 2, uncontrolled   Relevant Medications   losartan (COZAAR) 100 MG tablet   metFORMIN (GLUCOPHAGE) 1000 MG tablet   empagliflozin (JARDIANCE) 10 MG TABS tablet   Other Relevant Orders   POCT HgB A1C (Completed)   Dyslipidemia associated with type 2 diabetes mellitus (H[facility])    Abnormal lipid profile. Reluctant to take statin medications. Cardiovascular risks associated with  dyslipidemia discussed. Diet and nutrition discussed.      Relevant Medications   losartan (COZAAR) 100 MG tablet   metFORMIN (GLUCOPHAGE) 1000 MG tablet   empagliflozin (JARDIANCE) 10 MG TABS tablet   Other Relevant Orders   Comprehensive metabolic panel   Lipid panel     Genitourinary   Stage 3a chronic kidney disease ([place])    Advised to stay well-hydrated and avoid NSAIDs. We will start Jardiance 10 mg daily for diabetes and kidney protection.        Other   Noncompliance with medications    Still an issue.      Dysuria    Urinalysis and urine culture done today. May need antibiotics.      Relevant Orders   Urinalysis   Urine Culture   Other Visit Diagnoses     Essential hypertension       Relevant Medications   losartan (COZAAR) 100 MG tablet      Patient Instructions  Diabetes Mellitus and Nutrition, Adult When you have diabetes, or diabetes mellitus, it is very important to have healthy eating habits because your blood sugar (glucose) levels are greatly affected by what you eat and drink. Eating healthy foods in the right amounts, at about the same times every day, can help you: Manage your blood glucose. Lower your risk of heart disease. Improve your blood pressure. Reach or maintain a healthy weight. What can affect my meal plan? Every person with diabetes is different, and each person has different needs for a meal plan. Your health care provider may recommend that you work with a dietitian to make a meal plan that is best for you. Your meal plan may vary depending on factors such as: The calories you need. The medicines you take. Your weight. Your blood glucose, blood pressure, and cholesterol levels. Your activity level. Other health conditions you have, such as heart or kidney disease. How do carbohydrates affect me? Carbohydrates, also called carbs, affect your blood glucose level more than any other type of food. Eating carbs raises  the amount of  glucose in your blood. It is important to know how many carbs you can safely have in each meal. This is different for every person. Your dietitian can help you calculate how many carbs you should have at each meal and for each snack. How does alcohol affect me? Alcohol can cause a decrease in blood glucose (hypoglycemia), especially if you use insulin or take certain diabetes medicines by mouth. Hypoglycemia can be a life-threatening condition. Symptoms of hypoglycemia, such as sleepiness, dizziness, and confusion, are similar to symptoms of having too much alcohol. Do not drink alcohol if: Your health care provider tells you not to drink. You are pregnant, may be pregnant, or are planning to become pregnant. If you drink alcohol: Limit how much you have to: 0-1 drink a day for women. 0-2 drinks a day for men. Know how much alcohol is in your drink. In the [place] one drink equals one 12 oz bottle of beer (355 mL), one 5 oz glass of wine (148 mL), or one 1 oz glass of hard liquor (44 mL). Keep yourself hydrated with water, diet soda, or unsweetened iced tea. Keep in mind that regular soda, juice, and other mixers may contain a lot of sugar and must be counted as carbs. What are tips for following this plan?  Reading food labels Start by checking the serving size on the Nutrition Facts label of packaged foods and drinks. The number of calories and the amount of carbs, fats, and other nutrients listed on the label are based on one serving of the item. Many items contain more than one serving per package. Check the total grams (g) of carbs in one serving. Check the number of grams of saturated fats and trans fats in one serving. Choose foods that have a low amount or none of these fats. Check the number of milligrams (mg) of salt (sodium) in one serving. Most people should limit total sodium intake to less than 2,300 mg per day. Always check the nutrition information of foods labeled as "low-fat" or  "nonfat." These foods may be higher in added sugar or refined carbs and should be avoided. Talk to your dietitian to identify your daily goals for nutrients listed on the label. Shopping Avoid buying canned, pre-made, or processed foods. These foods tend to be high in fat, sodium, and added sugar. Shop around the outside edge of the grocery store. This is where you will most often find fresh fruits and vegetables, bulk grains, fresh meats, and fresh dairy products. Cooking Use low-heat cooking methods, such as baking, instead of high-heat cooking methods, such as deep frying. Cook using healthy oils, such as olive, canola, or sunflower oil. Avoid cooking with butter, cream, or high-fat meats. Meal planning Eat meals and snacks regularly, preferably at the same times every day. Avoid going long periods of time without eating. Eat foods that are high in fiber, such as fresh fruits, vegetables, beans, and whole grains. Eat 4-6 oz (112-168 g) of lean protein each day, such as lean meat, chicken, fish, eggs, or tofu. One ounce (oz) (28 g) of lean protein is equal to: 1 oz (28 g) of meat, chicken, or fish. 1 egg.  cup (62 g) of tofu. Eat some foods each day that contain healthy fats, such as avocado, nuts, seeds, and fish. What foods should I eat? Fruits Berries. Apples. Oranges. Peaches. Apricots. Plums. Grapes. Mangoes. Papayas. Pomegranates. Kiwi. Cherries. Vegetables Leafy greens, including lettuce, spinach, kale, chard, collard greens, mustard  greens, and cabbage. Beets. Cauliflower. Broccoli. Carrots. Green beans. Tomatoes. Peppers. Onions. Cucumbers. Brussels sprouts. Grains Whole grains, such as whole-wheat or whole-grain bread, crackers, tortillas, cereal, and pasta. Unsweetened oatmeal. Quinoa. Brown or wild rice. Meats and other proteins Seafood. Poultry without skin. Lean cuts of poultry and beef. Tofu. Nuts. Seeds. Dairy Low-fat or fat-free dairy products such as milk, yogurt, and  cheese. The items listed above may not be a complete list of foods and beverages you can eat and drink. Contact a dietitian for more information. What foods should I avoid? Fruits Fruits canned with syrup. Vegetables Canned vegetables. Frozen vegetables with butter or cream sauce. Grains Refined white flour and flour products such as bread, pasta, snack foods, and cereals. Avoid all processed foods. Meats and other proteins Fatty cuts of meat. Poultry with skin. Breaded or fried meats. Processed meat. Avoid saturated fats. Dairy Full-fat yogurt, cheese, or milk. Beverages Sweetened drinks, such as soda or iced tea. The items listed above may not be a complete list of foods and beverages you should avoid. Contact a dietitian for more information. Questions to ask a health care provider Do I need to meet with a certified diabetes care and education specialist? Do I need to meet with a dietitian? What number can I call if I have questions? When are the best times to check my blood glucose? Where to find more information: American Diabetes Association: diabetes.org Academy of Nutrition and Dietetics: eatright.[employer] of Diabetes and Digestive and Kidney Diseases: [employer] Association of Diabetes Care & Education Specialists: diabeteseducator.org Summary It is important to have healthy eating habits because your blood sugar (glucose) levels are greatly affected by what you eat and drink. It is important to use alcohol carefully. A healthy meal plan will help you manage your blood glucose and lower your risk of heart disease. Your health care provider may recommend that you work with a dietitian to make a meal plan that is best for you. This information is not intended to replace advice given to you by your health care provider. Make sure you discuss any questions you have with your health care provider. Document Revised: 10/13/2019 Document Reviewed: 10/13/2019 Elsevier  Patient Education  [place], MD [facility]

## 2022-01-15 NOTE — Patient Instructions (Signed)

## 2022-01-15 NOTE — Assessment & Plan Note (Signed)
Still an issue.

## 2022-01-16 ENCOUNTER — Other Ambulatory Visit: Payer: Self-pay | Admitting: Emergency Medicine

## 2022-01-16 DIAGNOSIS — N3001 Acute cystitis with hematuria: Secondary | ICD-10-CM

## 2022-01-16 LAB — URINALYSIS, ROUTINE W REFLEX MICROSCOPIC
Bilirubin Urine: NEGATIVE
Ketones, ur: NEGATIVE
Nitrite: POSITIVE — AB
Specific Gravity, Urine: 1.01 (ref 1.000–1.030)
Urine Glucose: >=1000 — AB
Urobilinogen, UA: 0.2 (ref 0.0–1.0)
pH: 7 (ref 5.0–8.0)

## 2022-01-16 MED ORDER — CIPROFLOXACIN HCL 500 MG PO TABS: 500.0000 mg | ORAL_TABLET | Freq: Two times a day (BID) | ORAL | 0 refills | Status: AC

## 2022-01-18 DIAGNOSIS — H401132 Primary open-angle glaucoma, bilateral, moderate stage: Secondary | ICD-10-CM | POA: Diagnosis not present

## 2022-01-18 LAB — URINE CULTURE

## 2022-01-21 NOTE — Progress Notes (Signed)
Thank you :)

## 2022-01-22 NOTE — Patient Instructions (Signed)

## 2022-01-22 NOTE — Progress Notes (Unsigned)
Subjective:   Carly Moore is a 76 y.o. female who presents for Medicare Annual (Subsequent) preventive examination. I connected with  Etta Grandchild on 01/23/22 by a audio enabled telemedicine application and verified that I am speaking with the correct person using two identifiers.  Patient Location: Home  Provider Location: Home Office  I discussed the limitations of evaluation and management by telemedicine. The patient expressed understanding and agreed to proceed.  Review of Systems    Deferred to PCP Cardiac Risk Factors include: diabetes mellitus;dyslipidemia;hypertension     Objective:    There were no vitals filed for this visit. There is no height or weight on file to calculate BMI.     01/23/2022   10:59 AM 02/22/2019   11:08 AM  Advanced Directives  Does Patient Have a Medical Advance Directive? Yes   Type of Estate agent of [place];Living will Healthcare Power of Attorney  Does patient want to make changes to medical advance directive? No - Patient declined Yes (Inpatient - patient defers changing a medical advance directive at this time - Information given)  Copy of Healthcare Power of Attorney in Chart? No - copy requested   Would patient like information on creating a medical advance directive?  No - Patient declined    Current Medications (verified) Outpatient Encounter Medications as of 01/23/2022  Medication Sig   Calcium-Magnesium-Vitamin D (CALCIUM MAGNESIUM PO) Take by mouth daily.   ciprofloxacin (CIPRO) 500 MG tablet Take 1 tablet (500 mg total) by mouth 2 (two) times daily for 7 days.   co-enzyme Q-10 30 MG capsule Take 30 mg by mouth 3 (three) times daily.     Dulaglutide (TRULICITY) 0.75 MG/0.5ML SOPN Inject 0.75 mg into the skin once a week.   empagliflozin (JARDIANCE) 10 MG TABS tablet Take 1 tablet (10 mg total) by mouth daily before breakfast.   fish oil-omega-3 fatty acids 1000 MG capsule Take 2 g by mouth daily.      latanoprost (XALATAN) 0.005 % ophthalmic solution    losartan (COZAAR) 100 MG tablet Take 1 tablet (100 mg total) by mouth daily.   metFORMIN (GLUCOPHAGE) 1000 MG tablet Take 1 tablet (1,000 mg total) by mouth 2 (two) times daily with a meal. TAKE ONE TABLET BY MOUTH TWICE DAILY   VITAMIN D, CHOLECALCIFEROL, PO Take by mouth daily.   glucosamine-chondroitin 500-400 MG tablet Take 1 tablet by mouth 3 (three) times daily.   (Patient not taking: Reported on 01/23/2022)   No facility-administered encounter medications on file as of 01/23/2022.    Allergies (verified) Sulfa antibiotics   History: Past Medical History:  Diagnosis Date   Diabetes mellitus without complication (HCC)    Phreesia 09/13/2019   Diabetes type 2, controlled (HCC)    Glaucoma    HTN (hypertension)    Hyperlipidemia    Hypertension    Phreesia 09/13/2019   Past Surgical History:  Procedure Laterality Date   EYE SURGERY N/A    Phreesia 09/13/2019   Family History  Problem Relation Age of Onset   Heart disease Mother    Cancer Father    Diabetes Father    Alcohol abuse Brother    Social History   Socioeconomic History   Marital status: Single    Spouse name: Not on file   Number of children: 1   Years of education: Not on file   Highest education level: Not on file  Occupational History   Occupation: Engineer, agricultural  Tobacco Use  Smoking status: Never   Smokeless tobacco: Never  Vaping Use   Vaping Use: Never used  Substance and Sexual Activity   Alcohol use: Yes   Drug use: No   Sexual activity: Not Currently  Other Topics Concern   Not on file  Social History Narrative   Single   1 son   Social Determinants of Health   Financial Resource Strain: Low Risk  (01/23/2022)   Overall Financial Resource Strain (CARDIA)    Difficulty of Paying Living Expenses: Not hard at all  Food Insecurity: No Food Insecurity (01/23/2022)   Hunger Vital Sign    Worried About Running Out of Food in the Last  Year: Never true    Ran Out of Food in the Last Year: Never true  Transportation Needs: No Transportation Needs (01/23/2022)   PRAPARE - Administrator, Civil Service (Medical): No    Lack of Transportation (Non-Medical): No  Physical Activity: Inactive (01/23/2022)   Exercise Vital Sign    Days of Exercise per Week: 0 days    Minutes of Exercise per Session: 0 min  Stress: No Stress Concern Present (01/23/2022)   [employer] of Occupational Health - Occupational Stress Questionnaire    Feeling of Stress : Not at all  Social Connections: Socially Isolated (01/23/2022)   Social Connection and Isolation Panel [NHANES]    Frequency of Communication with Friends and Family: More than three times a week    Frequency of Social Gatherings with Friends and Family: More than three times a week    Attends Religious Services: Never    Database administrator or Organizations: No    Attends Banker Meetings: Never    Marital Status: Widowed    Tobacco Counseling Counseling given: Not Answered   Clinical Intake:  Pre-visit preparation completed: Yes  Pain : No/denies pain     Nutritional Status: BMI 25 -29 Overweight Nutritional Risks: None Diabetes: Yes CBG done?: No (phone visit) Did pt. bring in CBG monitor from home?: No  How often do you need to have someone help you when you read instructions, pamphlets, or other written materials from your doctor or pharmacy?: 1 - Never What is the last grade level you completed in school?: 12th  Diabetic?Yes Nutrition Risk Assessment:  Has the patient had any N/V/D within the last 2 months?  No  Does the patient have any non-healing wounds?  No  Has the patient had any unintentional weight loss or weight gain?  No   Diabetes:  Is the patient diabetic?  Yes  If diabetic, was a CBG obtained today?  No phone visit Did the patient bring in their glucometer from home?  No , phone visit How often do you monitor  your CBG's? occasionally.   Financial Strains and Diabetes Management:  Are you having any financial strains with the device, your supplies or your medication? No .  Does the patient want to be seen by Chronic Care Management for management of their diabetes?  No  Would the patient like to be referred to a Nutritionist or for Diabetic Management?  No   Diabetic Exams:  Diabetic Eye Exam: Completed 05/22/21 Diabetic Foot Exam: Overdue, Pt has been advised about the importance in completing this exam. Pt is scheduled for diabetic foot exam on Deferred to PCP.   Interpreter Needed?: No  Information entered by :: Blanchie Serve RN   Activities of Daily Living    01/23/2022   10:59 AM  In your present state of health, do you have any difficulty performing the following activities:  Hearing? 0  Vision? 0  Difficulty concentrating or making decisions? 0  Walking or climbing stairs? 0  Dressing or bathing? 0  Doing errands, shopping? 0  Preparing Food and eating ? N  Using the Toilet? N  In the past six months, have you accidently leaked urine? N  Do you have problems with loss of bowel control? N  Managing your Medications? N  Managing your Finances? N  Housekeeping or managing your Housekeeping? N    Patient Care Team: Horald Pollen, MD as PCP - General (Internal Medicine)  Indicate any recent Medical Services you may have received from other than [facility] providers in the past year (date may be approximate).     Assessment:   This is a routine wellness examination for [place].  Hearing/Vision screen No results found.  Dietary issues and exercise activities discussed: Current Exercise Habits: The patient does not participate in regular exercise at present, Exercise limited by: None identified   Goals Addressed             This Visit's Progress    Patient Stated       I am trying to change my eating habits by decreasing the amount of sweets I eat.       Depression Screen    01/23/2022   11:15 AM 01/15/2022   10:07 AM 02/07/2021   10:21 AM 11/09/2019    1:52 PM 09/14/2019   12:20 PM 05/12/2019   11:47 AM 02/22/2019   11:11 AM  PHQ 2/9 Scores  PHQ - 2 Score 0 0 0 0 0 0 0    Fall Risk    01/23/2022   11:00 AM 01/15/2022   10:07 AM 02/07/2021   10:21 AM 11/09/2019    1:52 PM 09/14/2019   12:20 PM  [place] in the past year? 0 0 0 0 0  Number falls in past yr: 0 0 0 0   Injury with Fall? 0 0 0 0   Risk for fall due to : No Fall Risks No Fall Risks     Follow up Falls evaluation completed Falls evaluation completed  Falls evaluation completed Falls evaluation completed    [place]:  Any stairs in or around the home? Yes  If so, are there any without handrails? Yes  Home free of loose throw rugs in walkways, pet beds, electrical cords, etc? Yes  Adequate lighting in your home to reduce risk of falls? Yes   ASSISTIVE DEVICES UTILIZED TO PREVENT FALLS:  Life alert? No  Use of a cane, walker or w/c? No  Grab bars in the bathroom? Yes  Shower chair or bench in shower? No  Elevated toilet seat or a handicapped toilet? No   Cognitive Function:        01/23/2022   11:00 AM 02/22/2019   11:09 AM  6CIT Screen  What Year? 0 points 0 points  What month? 0 points 0 points  What time? 0 points 0 points  Count back from 20 0 points 0 points  Months in reverse 0 points 0 points  Repeat phrase 0 points 0 points  Total Score 0 points 0 points    Immunizations Immunization History  Administered Date(s) Administered   Influenza Split 12/25/2011   Influenza,inj,Quad PF,6+ Mos 04/28/2013, 02/03/2014, 03/07/2015    TDAP status: Due, Education has been provided  regarding the importance of this vaccine. Advised may receive this vaccine at local pharmacy or Health Dept. Aware to provide a copy of the vaccination record if obtained from local pharmacy or Health Dept. Verbalized acceptance and  understanding.  Flu Vaccine status: Due, Education has been provided regarding the importance of this vaccine. Advised may receive this vaccine at local pharmacy or Health Dept. Aware to provide a copy of the vaccination record if obtained from local pharmacy or Health Dept. Verbalized acceptance and understanding.  Pneumococcal vaccine status: Due, Education has been provided regarding the importance of this vaccine. Advised may receive this vaccine at local pharmacy or Health Dept. Aware to provide a copy of the vaccination record if obtained from local pharmacy or Health Dept. Verbalized acceptance and understanding.  Covid-19 vaccine status: Information provided on how to obtain vaccines.   Qualifies for Shingles Vaccine? Yes   Zostavax completed No   Shingrix Completed?: No.    Education has been provided regarding the importance of this vaccine. Patient has been advised to call insurance company to determine out of pocket expense if they have not yet received this vaccine. Advised may also receive vaccine at local pharmacy or Health Dept. Verbalized acceptance and understanding.  Screening Tests Health Maintenance  Topic Date Due   COVID-19 Vaccine (1) Never done   Hepatitis C Screening  Never done   DEXA SCAN  Never done   MAMMOGRAM  05/05/2014   FOOT EXAM  04/11/2018   OPHTHALMOLOGY EXAM  02/09/2020   Zoster Vaccines- Shingrix (1 of 2) 04/17/2022 (Originally 10/17/1994)   INFLUENZA VACCINE  06/23/2022 (Originally 10/23/2021)   Pneumonia Vaccine 13+ Years old (1 - PCV) 01/16/2023 (Originally 10/16/2009)   TETANUS/TDAP  01/16/2023 (Originally 10/17/1964)   HEMOGLOBIN A1C  07/17/2022   Diabetic kidney evaluation - GFR measurement  01/16/2023   Diabetic kidney evaluation - Urine ACR  01/16/2023   Medicare Annual Wellness (AWV)  01/24/2023   HPV VACCINES  Aged Out    Health Maintenance  Health Maintenance Due  Topic Date Due   COVID-19 Vaccine (1) Never done   Hepatitis C Screening   Never done   DEXA SCAN  Never done   MAMMOGRAM  05/05/2014   FOOT EXAM  04/11/2018   OPHTHALMOLOGY EXAM  02/09/2020    Colorectal cancer screening: No longer required.   Mammogram status: No longer required due to age.  Bone Density Status: Patient states she will discuss with PCP  Lung Cancer Screening: (Low Dose CT Chest recommended if Age 61-80 years, 30 pack-year currently smoking OR have quit w/in 15years.) does not qualify.   Additional Screening:    Hepatitis C Screening: does qualify; Completed education provided  Vision Screening: Recommended annual ophthalmology exams for early detection of glaucoma and other disorders of the eye. Is the patient up to date with their annual eye exam?  Yes  Who is the provider or what is the name of the office in which the patient attends annual eye exams? [facility] If pt is not established with a provider, would they like to be referred to a provider to establish care?  N/A .   Dental Screening: Recommended annual dental exams for proper oral hygiene  Community Resource Referral / Chronic Care Management: CRR required this visit?  No   CCM required this visit?  No      Plan:     I have personally reviewed and noted the following in the patient's chart:   Medical and  social history Use of alcohol, tobacco or illicit drugs  Current medications and supplements including opioid prescriptions. Patient is not currently taking opioid prescriptions. Functional ability and status Nutritional status Physical activity Advanced directives List of other physicians Hospitalizations, surgeries, and ER visits in previous 12 months Vitals Screenings to include cognitive, depression, and falls Referrals and appointments  In addition, I have reviewed and discussed with patient certain preventive protocols, quality metrics, and best practice recommendations. A written personalized care plan for preventive services as well as  general preventive health recommendations were provided to patient.     Wanda PlumpJill A Margherita Collyer, RN   01/23/2022   Nurse Notes:  Ms. Carly Moore , Thank you for taking time to come for your Medicare Wellness Visit. I appreciate your ongoing commitment to your health goals. Please review the following plan we discussed and let me know if I can assist you in the future.   These are the goals we discussed:  Goals       Patient Stated     Patient Stated (pt-stated)      Get back on diet to reduce A!C to 7      Other     Patient Stated      I am trying to change my eating habits by decreasing the amount of sweets I eat.        This is a list of the screening recommended for you and due dates:  Health Maintenance  Topic Date Due   COVID-19 Vaccine (1) Never done   Hepatitis C Screening: USPSTF Recommendation to screen - Ages 7018-79 yo.  Never done   DEXA scan (bone density measurement)  Never done   Mammogram  05/05/2014   Complete foot exam   04/11/2018   Eye exam for diabetics  02/09/2020   Zoster (Shingles) Vaccine (1 of 2) 04/17/2022*   Flu Shot  06/23/2022*   Pneumonia Vaccine (1 - PCV) 01/16/2023*   Tetanus Vaccine  01/16/2023*   Hemoglobin A1C  07/17/2022   Yearly kidney function blood test for diabetes  01/16/2023   Yearly kidney health urinalysis for diabetes  01/16/2023   Medicare Annual Wellness Visit  01/24/2023   HPV Vaccine  Aged Out  *Topic was postponed. The date shown is not the original due date.

## 2022-01-23 ENCOUNTER — Ambulatory Visit (INDEPENDENT_AMBULATORY_CARE_PROVIDER_SITE_OTHER): Payer: Medicare HMO | Admitting: *Deleted

## 2022-01-23 DIAGNOSIS — Z0000 Encounter for general adult medical examination without abnormal findings: Secondary | ICD-10-CM | POA: Diagnosis not present

## 2022-07-22 DIAGNOSIS — H401131 Primary open-angle glaucoma, bilateral, mild stage: Secondary | ICD-10-CM | POA: Diagnosis not present

## 2022-07-22 DIAGNOSIS — E119 Type 2 diabetes mellitus without complications: Secondary | ICD-10-CM | POA: Diagnosis not present

## 2022-08-14 DIAGNOSIS — H52223 Regular astigmatism, bilateral: Secondary | ICD-10-CM | POA: Diagnosis not present

## 2022-08-14 DIAGNOSIS — H524 Presbyopia: Secondary | ICD-10-CM | POA: Diagnosis not present

## 2022-09-24 ENCOUNTER — Ambulatory Visit (INDEPENDENT_AMBULATORY_CARE_PROVIDER_SITE_OTHER): Payer: Medicare HMO | Admitting: Emergency Medicine

## 2022-09-24 ENCOUNTER — Encounter: Payer: Self-pay | Admitting: Gastroenterology

## 2022-09-24 ENCOUNTER — Encounter: Payer: Self-pay | Admitting: Emergency Medicine

## 2022-09-24 VITALS — BP 138/78 | HR 77 | Temp 98.3°F | Ht 62.0 in | Wt 135.0 lb

## 2022-09-24 DIAGNOSIS — Z7984 Long term (current) use of oral hypoglycemic drugs: Secondary | ICD-10-CM

## 2022-09-24 DIAGNOSIS — N1831 Chronic kidney disease, stage 3a: Secondary | ICD-10-CM | POA: Diagnosis not present

## 2022-09-24 DIAGNOSIS — E1159 Type 2 diabetes mellitus with other circulatory complications: Secondary | ICD-10-CM | POA: Diagnosis not present

## 2022-09-24 DIAGNOSIS — E785 Hyperlipidemia, unspecified: Secondary | ICD-10-CM

## 2022-09-24 DIAGNOSIS — I152 Hypertension secondary to endocrine disorders: Secondary | ICD-10-CM | POA: Diagnosis not present

## 2022-09-24 DIAGNOSIS — E1169 Type 2 diabetes mellitus with other specified complication: Secondary | ICD-10-CM | POA: Diagnosis not present

## 2022-09-24 DIAGNOSIS — R1319 Other dysphagia: Secondary | ICD-10-CM | POA: Diagnosis not present

## 2022-09-24 LAB — LIPID PANEL
Cholesterol: 251 mg/dL — ABNORMAL HIGH (ref 0–200)
HDL: 50.7 mg/dL (ref >39.00)
NonHDL: 200.51
Total CHOL/HDL Ratio: 5
Triglycerides: 285 mg/dL — ABNORMAL HIGH (ref 0.0–149.0)
VLDL: 57 mg/dL — ABNORMAL HIGH (ref 0.0–40.0)

## 2022-09-24 LAB — COMPREHENSIVE METABOLIC PANEL
ALT: 15 U/L (ref 0–35)
AST: 18 U/L (ref 0–37)
Albumin: 4.2 g/dL (ref 3.5–5.2)
Alkaline Phosphatase: 78 U/L (ref 39–117)
BUN: 14 mg/dL (ref 6–23)
CO2: 25 mEq/L (ref 19–32)
Calcium: 9.9 mg/dL (ref 8.4–10.5)
Chloride: 101 mEq/L (ref 96–112)
Creatinine, Ser: 1.14 mg/dL (ref 0.40–1.20)
GFR: 46.3 mL/min — ABNORMAL LOW (ref >60.00)
Glucose, Bld: 199 mg/dL — ABNORMAL HIGH (ref 70–99)
Potassium: 3.7 mEq/L (ref 3.5–5.1)
Sodium: 137 mEq/L (ref 135–145)
Total Bilirubin: 0.6 mg/dL (ref 0.2–1.2)
Total Protein: 7.6 g/dL (ref 6.0–8.3)

## 2022-09-24 LAB — CBC WITH DIFFERENTIAL/PLATELET
Basophils Absolute: 0 10*3/uL (ref 0.0–0.1)
Basophils Relative: 0.6 % (ref 0.0–3.0)
Eosinophils Absolute: 0.3 10*3/uL (ref 0.0–0.7)
Eosinophils Relative: 3.9 % (ref 0.0–5.0)
HCT: 47.2 % — ABNORMAL HIGH (ref 36.0–46.0)
Hemoglobin: 15.7 g/dL — ABNORMAL HIGH (ref 12.0–15.0)
Lymphocytes Relative: 36.5 % (ref 12.0–46.0)
Lymphs Abs: 2.7 10*3/uL (ref 0.7–4.0)
MCHC: 33.3 g/dL (ref 30.0–36.0)
MCV: 92.7 fl (ref 78.0–100.0)
Monocytes Absolute: 0.4 10*3/uL (ref 0.1–1.0)
Monocytes Relative: 5.5 % (ref 3.0–12.0)
Neutro Abs: 3.9 10*3/uL (ref 1.4–7.7)
Neutrophils Relative %: 53.5 % (ref 43.0–77.0)
Platelets: 321 10*3/uL (ref 150.0–400.0)
RBC: 5.09 Mil/uL (ref 3.87–5.11)
RDW: 13.4 % (ref 11.5–15.5)
WBC: 7.4 10*3/uL (ref 4.0–10.5)

## 2022-09-24 LAB — LDL CHOLESTEROL, DIRECT: Direct LDL: 174 mg/dL

## 2022-09-24 LAB — POCT GLYCOSYLATED HEMOGLOBIN (HGB A1C): Hemoglobin A1C: 8 % — AB (ref 4.0–5.6)

## 2022-09-24 NOTE — Assessment & Plan Note (Signed)
Elevated blood pressure reading in the office but normal at home Continues losartan 100 mg daily Hemoglobin A1c still not at goal but better than before at 8.0 Eating better. Cardiovascular risk associated with hypertension and diabetes discussed Continue metformin 1000 mg twice a day and Jardiance 10 mg daily We will follow-up in 3 to 6 months

## 2022-09-24 NOTE — Assessment & Plan Note (Addendum)
Chronic stable condition Not taking cholesterol medication.  Does not want to take cholesterol medication. The 10-year ASCVD risk score (Arnett DK, et al., 2019) is: 54.6%   Values used to calculate the score:     Age: 77 years     Sex: Female     Is Non-Hispanic African American: No     Diabetic: Yes     Tobacco smoker: No     Systolic Blood Pressure: 150 mmHg     Is BP treated: Yes     HDL Cholesterol: 52.8 mg/dL     Total Cholesterol: 239 mg/dL

## 2022-09-24 NOTE — Patient Instructions (Signed)

## 2022-09-24 NOTE — Progress Notes (Signed)
Carly Moore 77 y.o.   Chief complaint: Follow-up of chronic medical conditions including diabetes  HISTORY OF PRESENT ILLNESS: This is a 77 y.o. female here for 60-month follow-up of chronic medical conditions including hypertension, diabetes, and dyslipidemia Also complaining of swallowing issues worse over the past 2 months Lab Results  Component Value Date   HGBA1C 10.1 (A) 01/15/2022   BP Readings from Last 3 Encounters:  09/24/22 (!) 150/82  01/15/22 130/70  02/07/21 136/86     HPI   Prior to Admission medications   Medication Sig Start Date End Date Taking? Authorizing Provider  Calcium-Magnesium-Vitamin D (CALCIUM MAGNESIUM PO) Take by mouth daily.   Yes [provider]  co-enzyme Q-10 30 MG capsule Take 30 mg by mouth 3 (three) times daily.     Yes [provider]  empagliflozin (JARDIANCE) 10 MG TABS tablet Take 1 tablet (10 mg total) by mouth daily before breakfast. 01/15/22  Yes Heyli Min, Eilleen Kempf, MD  fish oil-omega-3 fatty acids 1000 MG capsule Take 2 g by mouth daily.     Yes [provider]  latanoprost (XALATAN) 0.005 % ophthalmic solution  02/18/15  Yes [provider]  losartan (COZAAR) 100 MG tablet Take 1 tablet (100 mg total) by mouth daily. 01/15/22 01/10/23 Yes Kenndra Morris, Eilleen Kempf, MD  metFORMIN (GLUCOPHAGE) 1000 MG tablet Take 1 tablet (1,000 mg total) by mouth 2 (two) times daily with a meal. TAKE ONE TABLET BY MOUTH TWICE DAILY 01/15/22 01/10/23 Yes Deiondre Harrower, Eilleen Kempf, MD  timolol (TIMOPTIC) 0.5 % ophthalmic solution Place 1 drop into both eyes at bedtime. 08/14/22  Yes [provider]  Dulaglutide (TRULICITY) 0.75 MG/0.5ML SOPN Inject 0.75 mg into the skin once a week. 02/07/21   Georgina Quint, MD  glucosamine-chondroitin 500-400 MG tablet Take 1 tablet by mouth 3 (three) times daily.   Patient not taking: Reported on 01/23/2022    [provider]  VITAMIN D, CHOLECALCIFEROL, PO Take  by mouth daily.    [provider]    Allergies  Allergen Reactions   Sulfa Antibiotics Rash    Patient Active Problem List   Diagnosis Date Noted   Dyslipidemia associated with type 2 diabetes mellitus (HCC) 01/15/2022   Stage 3a chronic kidney disease (HCC) 01/15/2022   History of glaucoma 11/09/2019   Vitamin D deficiency 06/11/2019   Noncompliance with medications 06/11/2019   Glaucoma    Hypertension associated with diabetes (HCC)    Diabetes type 2, uncontrolled    Dyslipidemia     Past Medical History:  Diagnosis Date   Diabetes mellitus without complication (HCC)    [employer] 09/13/2019   Diabetes type 2, controlled (HCC)    Glaucoma    HTN (hypertension)    Hyperlipidemia    Hypertension    [employer] 09/13/2019    Past Surgical History:  Procedure Laterality Date   EYE SURGERY N/A    [employer] 09/13/2019    Social History   Socioeconomic History   Marital status: Single    Spouse name: Not on file   Number of children: 1   Years of education: Not on file   Highest education level: Associate degree: occupational, Scientist, product/process development, or vocational program  Occupational History   Occupation: Engineer, agricultural  Tobacco Use   Smoking status: Never   Smokeless tobacco: Never  Vaping Use   Vaping Use: Never used  Substance and Sexual Activity   Alcohol use: Yes   Drug use: No   Sexual activity: Not Currently  Other Topics Concern   Not on file  Social History Narrative   Single   1 son   Social Determinants of Health   Financial Resource Strain: Low Risk  (09/23/2022)   Overall Financial Resource Strain (CARDIA)    Difficulty of Paying Living Expenses: Not hard at all  Food Insecurity: No Food Insecurity (09/23/2022)   Hunger Vital Sign    Worried About Running Out of Food in the Last Year: Never true    Ran Out of Food in the Last Year: Never true  Transportation Needs: No Transportation Needs (09/23/2022)   PRAPARE - Scientist, research (physical sciences) (Medical): No    Lack of Transportation (Non-Medical): No  Physical Activity: Insufficiently Active (09/23/2022)   Exercise Vital Sign    Days of Exercise per Week: 1 day    Minutes of Exercise per Session: 30 min  Stress: No Stress Concern Present (09/23/2022)   [employer] of Occupational Health - Occupational Stress Questionnaire    Feeling of Stress : Not at all  Social Connections: Unknown (09/23/2022)   Social Connection and Isolation Panel [NHANES]    Frequency of Communication with Friends and Family: More than three times a week    Frequency of Social Gatherings with Friends and Family: Once a week    Attends Religious Services: Patient declined    Database administrator or Organizations: No    Attends Banker Meetings: Never    Marital Status: Never married  Intimate Partner Violence: Not At Risk (01/23/2022)   Humiliation, Afraid, Rape, and Kick questionnaire    Fear of Current or Ex-Partner: No    Emotionally Abused: No    Physically Abused: No    Sexually Abused: No    Family History  Problem Relation Age of Onset   Heart disease Mother    Cancer Father    Diabetes Father    Alcohol abuse Brother      Review of Systems  Constitutional: Negative.  Negative for chills and fever.  HENT: Negative.  Negative for congestion and sore throat.   Respiratory: Negative.  Negative for cough and shortness of breath.   Cardiovascular: Negative.  Negative for chest pain and palpitations.  Gastrointestinal:  Negative for abdominal pain, nausea and vomiting.       Trouble swallowing  Genitourinary: Negative.   Skin: Negative.  Negative for rash.  Neurological: Negative.  Negative for dizziness and headaches.  All other systems reviewed and are negative.   Vitals:   09/24/22 0943  BP: (!) 150/82  Pulse: 79  Temp: 98.3 F (36.8 C)  SpO2: 96%    Physical Exam Vitals reviewed.  Constitutional:      Appearance: Normal appearance.  HENT:      Head: Normocephalic.     Mouth/Throat:     Mouth: Mucous membranes are moist.     Pharynx: Oropharynx is clear.  Eyes:     Extraocular Movements: Extraocular movements intact.     Conjunctiva/sclera: Conjunctivae normal.     Pupils: Pupils are equal, round, and reactive to light.  Cardiovascular:     Rate and Rhythm: Normal rate and regular rhythm.     Pulses: Normal pulses.     Heart sounds: Normal heart sounds.  Pulmonary:     Effort: Pulmonary effort is normal.     Breath sounds: Normal breath sounds.  Abdominal:     Palpations: Abdomen is soft.     Tenderness: There is no abdominal  tenderness.  Musculoskeletal:     Cervical back: No tenderness.  Lymphadenopathy:     Cervical: No cervical adenopathy.  Skin:    General: Skin is warm and dry.     Capillary Refill: Capillary refill takes less than 2 seconds.  Neurological:     General: No focal deficit present.     Mental Status: She is alert and oriented to person, place, and time.  Psychiatric:        Mood and Affect: Mood normal.        Behavior: Behavior normal.    Results for orders placed or performed in visit on 09/24/22 (from the past 24 hour(s))  POCT glycosylated hemoglobin (Hb A1C)     Status: Abnormal   Collection Time: 09/24/22 10:01 AM  Result Value Ref Range   Hemoglobin A1C 8.0 (A) 4.0 - 5.6 %   HbA1c POC (<> result, manual entry)     HbA1c, POC (prediabetic range)     HbA1c, POC (controlled diabetic range)       ASSESSMENT & PLAN: A total of 47 minutes was spent with the patient and counseling/coordination of care regarding preparing for this visit, review of most recent office visit notes, review of multiple chronic medical conditions under management, review of most recent blood work results including interpretation of today's hemoglobin A1c, cardiovascular risks associated with hypertension and diabetes, review of all medications, education on nutrition, prognosis, documentation and need for  follow-up.  Problem List Items Addressed This Visit       Cardiovascular and Mediastinum   Hypertension associated with diabetes (HCC) - Primary    Elevated blood pressure reading in the office but normal at home Continues losartan 100 mg daily Hemoglobin A1c still not at goal but better than before at 8.0 Eating better. Cardiovascular risk associated with hypertension and diabetes discussed Continue metformin 1000 mg twice a day and Jardiance 10 mg daily We will follow-up in 3 to 6 months      Relevant Orders   POCT glycosylated hemoglobin (Hb A1C) (Completed)   CBC with Differential/Platelet   Comprehensive metabolic panel   Lipid panel     Endocrine   Dyslipidemia associated with type 2 diabetes mellitus (HCC)    Chronic stable condition Not taking cholesterol medication.  Does not want to take cholesterol medication. The 10-year ASCVD risk score (Arnett DK, et al., 2019) is: 54.6%   Values used to calculate the score:     Age: 73 years     Sex: Female     Is Non-Hispanic African American: No     Diabetic: Yes     Tobacco smoker: No     Systolic Blood Pressure: 150 mmHg     Is BP treated: Yes     HDL Cholesterol: 52.8 mg/dL     Total Cholesterol: 239 mg/dL       Relevant Orders   POCT glycosylated hemoglobin (Hb A1C) (Completed)   CBC with Differential/Platelet   Comprehensive metabolic panel   Lipid panel     Genitourinary   Stage 3a chronic kidney disease (HCC)    Stable condition. Advised to stay well-hydrated and avoid NSAIDs      Relevant Orders   Comprehensive metabolic panel   Other Visit Diagnoses     Esophageal dysphagia       Relevant Orders   Ambulatory referral to Gastroenterology      Patient Instructions  Diabetes Mellitus and Nutrition, Adult When you have diabetes, or diabetes mellitus, it is  very important to have healthy eating habits because your blood sugar (glucose) levels are greatly affected by what you eat and drink. Eating  healthy foods in the right amounts, at about the same times every day, can help you: Manage your blood glucose. Lower your risk of heart disease. Improve your blood pressure. Reach or maintain a healthy weight. What can affect my meal plan? Every person with diabetes is different, and each person has different needs for a meal plan. Your health care provider may recommend that you work with a dietitian to make a meal plan that is best for you. Your meal plan may vary depending on factors such as: The calories you need. The medicines you take. Your weight. Your blood glucose, blood pressure, and cholesterol levels. Your activity level. Other health conditions you have, such as heart or kidney disease. How do carbohydrates affect me? Carbohydrates, also called carbs, affect your blood glucose level more than any other type of food. Eating carbs raises the amount of glucose in your blood. It is important to know how many carbs you can safely have in each meal. This is different for every person. Your dietitian can help you calculate how many carbs you should have at each meal and for each snack. How does alcohol affect me? Alcohol can cause a decrease in blood glucose (hypoglycemia), especially if you use insulin or take certain diabetes medicines by mouth. Hypoglycemia can be a life-threatening condition. Symptoms of hypoglycemia, such as sleepiness, dizziness, and confusion, are similar to symptoms of having too much alcohol. Do not drink alcohol if: Your health care provider tells you not to drink. You are pregnant, may be pregnant, or are planning to become pregnant. If you drink alcohol: Limit how much you have to: 0-1 drink a day for women. 0-2 drinks a day for men. Know how much alcohol is in your drink. In the [place] one drink equals one 12 oz bottle of beer (355 mL), one 5 oz glass of wine (148 mL), or one 1 oz glass of hard liquor (44 mL). Keep yourself hydrated with water, diet  soda, or unsweetened iced tea. Keep in mind that regular soda, juice, and other mixers may contain a lot of sugar and must be counted as carbs. What are tips for following this plan?  Reading food labels Start by checking the serving size on the Nutrition Facts label of packaged foods and drinks. The number of calories and the amount of carbs, fats, and other nutrients listed on the label are based on one serving of the item. Many items contain more than one serving per package. Check the total grams (g) of carbs in one serving. Check the number of grams of saturated fats and trans fats in one serving. Choose foods that have a low amount or none of these fats. Check the number of milligrams (mg) of salt (sodium) in one serving. Most people should limit total sodium intake to less than 2,300 mg per day. Always check the nutrition information of foods labeled as "low-fat" or "nonfat." These foods may be higher in added sugar or refined carbs and should be avoided. Talk to your dietitian to identify your daily goals for nutrients listed on the label. Shopping Avoid buying canned, pre-made, or processed foods. These foods tend to be high in fat, sodium, and added sugar. Shop around the outside edge of the grocery store. This is where you will most often find fresh fruits and vegetables, bulk grains, fresh meats, and  fresh dairy products. Cooking Use low-heat cooking methods, such as baking, instead of high-heat cooking methods, such as deep frying. Cook using healthy oils, such as olive, canola, or sunflower oil. Avoid cooking with butter, cream, or high-fat meats. Meal planning Eat meals and snacks regularly, preferably at the same times every day. Avoid going long periods of time without eating. Eat foods that are high in fiber, such as fresh fruits, vegetables, beans, and whole grains. Eat 4-6 oz (112-168 g) of lean protein each day, such as lean meat, chicken, fish, eggs, or tofu. One ounce (oz)  (28 g) of lean protein is equal to: 1 oz (28 g) of meat, chicken, or fish. 1 egg.  cup (62 g) of tofu. Eat some foods each day that contain healthy fats, such as avocado, nuts, seeds, and fish. What foods should I eat? Fruits Berries. Apples. Oranges. Peaches. Apricots. Plums. Grapes. Mangoes. Papayas. Pomegranates. Kiwi. Cherries. Vegetables Leafy greens, including lettuce, spinach, kale, chard, collard greens, mustard greens, and cabbage. Beets. Cauliflower. Broccoli. Carrots. Green beans. Tomatoes. Peppers. Onions. Cucumbers. Brussels sprouts. Grains Whole grains, such as whole-wheat or whole-grain bread, crackers, tortillas, cereal, and pasta. Unsweetened oatmeal. Quinoa. Brown or wild rice. Meats and other proteins Seafood. Poultry without skin. Lean cuts of poultry and beef. Tofu. Nuts. Seeds. Dairy Low-fat or fat-free dairy products such as milk, yogurt, and cheese. The items listed above may not be a complete list of foods and beverages you can eat and drink. Contact a dietitian for more information. What foods should I avoid? Fruits Fruits canned with syrup. Vegetables Canned vegetables. Frozen vegetables with butter or cream sauce. Grains Refined white flour and flour products such as bread, pasta, snack foods, and cereals. Avoid all processed foods. Meats and other proteins Fatty cuts of meat. Poultry with skin. Breaded or fried meats. Processed meat. Avoid saturated fats. Dairy Full-fat yogurt, cheese, or milk. Beverages Sweetened drinks, such as soda or iced tea. The items listed above may not be a complete list of foods and beverages you should avoid. Contact a dietitian for more information. Questions to ask a health care provider Do I need to meet with a certified diabetes care and education specialist? Do I need to meet with a dietitian? What number can I call if I have questions? When are the best times to check my blood glucose? Where to find more  information: American Diabetes Association: diabetes.org Academy of Nutrition and Dietetics: eatright.[employer] of Diabetes and Digestive and Kidney Diseases: StageSync.si Association of Diabetes Care & Education Specialists: diabeteseducator.org Summary It is important to have healthy eating habits because your blood sugar (glucose) levels are greatly affected by what you eat and drink. It is important to use alcohol carefully. A healthy meal plan will help you manage your blood glucose and lower your risk of heart disease. Your health care provider may recommend that you work with a dietitian to make a meal plan that is best for you. This information is not intended to replace advice given to you by your health care provider. Make sure you discuss any questions you have with your health care provider. Document Revised: 10/13/2019 Document Reviewed: 10/13/2019 Elsevier Patient Education  2024 Elsevier Inc.     Edwina Barth, MD [facility]

## 2022-09-24 NOTE — Assessment & Plan Note (Signed)
Stable condition. Advised to stay well-hydrated and avoid NSAIDs 

## 2022-10-21 ENCOUNTER — Other Ambulatory Visit: Payer: Self-pay | Admitting: Emergency Medicine

## 2022-10-21 DIAGNOSIS — E1165 Type 2 diabetes mellitus with hyperglycemia: Secondary | ICD-10-CM

## 2022-10-21 DIAGNOSIS — E1159 Type 2 diabetes mellitus with other circulatory complications: Secondary | ICD-10-CM

## 2022-11-13 DIAGNOSIS — Z7984 Long term (current) use of oral hypoglycemic drugs: Secondary | ICD-10-CM | POA: Diagnosis not present

## 2022-11-13 DIAGNOSIS — Z8249 Family history of ischemic heart disease and other diseases of the circulatory system: Secondary | ICD-10-CM | POA: Diagnosis not present

## 2022-11-13 DIAGNOSIS — Z833 Family history of diabetes mellitus: Secondary | ICD-10-CM | POA: Diagnosis not present

## 2022-11-13 DIAGNOSIS — E1151 Type 2 diabetes mellitus with diabetic peripheral angiopathy without gangrene: Secondary | ICD-10-CM | POA: Diagnosis not present

## 2022-11-13 DIAGNOSIS — H409 Unspecified glaucoma: Secondary | ICD-10-CM | POA: Diagnosis not present

## 2022-11-13 DIAGNOSIS — I10 Essential (primary) hypertension: Secondary | ICD-10-CM | POA: Diagnosis not present

## 2022-11-13 DIAGNOSIS — Z882 Allergy status to sulfonamides status: Secondary | ICD-10-CM | POA: Diagnosis not present

## 2022-11-13 DIAGNOSIS — E785 Hyperlipidemia, unspecified: Secondary | ICD-10-CM | POA: Diagnosis not present

## 2022-11-13 DIAGNOSIS — Z008 Encounter for other general examination: Secondary | ICD-10-CM | POA: Diagnosis not present

## 2022-12-25 ENCOUNTER — Ambulatory Visit: Payer: Medicare HMO | Admitting: Gastroenterology

## 2022-12-25 ENCOUNTER — Encounter: Payer: Self-pay | Admitting: Gastroenterology

## 2022-12-25 VITALS — BP 148/82 | HR 94 | Wt 139.0 lb

## 2022-12-25 DIAGNOSIS — R1314 Dysphagia, pharyngoesophageal phase: Secondary | ICD-10-CM | POA: Diagnosis not present

## 2022-12-25 NOTE — Progress Notes (Signed)
[facility] Consult Note:  History: Carly Moore 12/25/2022  Referring provider: Georgina Quint, MD  Reason for consult/chief complaint: Dysphagia (Having issues swallowing, takes longer to eat and chew thoroughly, can't swallow capsules either, cough a lot of debris up as well. )   Subjective  HPI: Carly Moore was referred to [place] by primary care for dysphagia. Today, she complains of dysphagia with food and capsules but denies any dysphagia with liquids.  Her symptoms have persisted for the past few years but has been experiencing worsening symptoms.  She states that she often has to eat slow and chew her food thoroughly and has to chew her medications as she's unable to swallow them. She states that if she doesn't completely chew her food, she's unable to swallow it, and may need to cough particles back up.  She reports experiencing few chocking episodes as a result. She also reports experiencing infrequent episodes of heartburn or regurgitation.  She states that she has lost few pounds as she has to eat very slowly which causes her eat less.   She denies undergoing any previous GI testing including colonoscopies or other colorectal cancer screening because she has not wanted to do so. She denies any smoking history or being diagnosed with scoliosis, enlarged thyroid or other throat issues.  No previous ENT visits.. She also denies any family history of colon, esophagus, or larynx cancer.   Patient denies diarrhea, constipation, nausea, blood in stool, black stool, vomiting, abdominal pain, bloating.  ROS:  Review of Systems  Constitutional:  Negative for appetite change and fever.  HENT:  Positive for trouble swallowing.   Respiratory:  Negative for cough and shortness of breath.   Cardiovascular:  Negative for chest pain.  Gastrointestinal:  Negative for abdominal distention, abdominal pain, anal bleeding, blood in stool, constipation, diarrhea, nausea,  rectal pain and vomiting.       +reflux/heartburn +food regurgitation   Genitourinary:  Negative for dysuria.  Musculoskeletal:  Negative for back pain.  Skin:  Negative for rash.  Neurological:  Negative for weakness.  All other systems reviewed and are negative.    Past Medical History: Past Medical History:  Diagnosis Date   Diabetes mellitus without complication (HCC)    Phreesia 09/13/2019   Diabetes type 2, controlled (HCC)    Elevated cholesterol    Glaucoma    Hyperlipidemia    Hypertension    Phreesia 09/13/2019   Presbyopia    UTI (urinary tract infection)      Past Surgical History: Past Surgical History:  Procedure Laterality Date   EYE SURGERY N/A    Phreesia 09/13/2019     Family History: Family History  Problem Relation Age of Onset   Heart disease Mother    Cancer Father    Diabetes Father    Alcohol abuse Brother     Social History: Social History   Socioeconomic History   Marital status: Single    Spouse name: Not on file   Number of children: 1   Years of education: Not on file   Highest education level: Associate degree: occupational, Scientist, product/process development, or vocational program  Occupational History   Occupation: Engineer, agricultural   Occupation: retired  Tobacco Use   Smoking status: Never   Smokeless tobacco: Never  Vaping Use   Vaping status: Never Used  Substance and Sexual Activity   Alcohol use: Yes   Drug use: No   Sexual activity: Not Currently  Other Topics Concern   Not on  file  Social History Narrative   Single   1 son   Social Determinants of Health   Financial Resource Strain: Low Risk  (09/23/2022)   Overall Financial Resource Strain (CARDIA)    Difficulty of Paying Living Expenses: Not hard at all  Food Insecurity: No Food Insecurity (09/23/2022)   Hunger Vital Sign    Worried About Running Out of Food in the Last Year: Never true    Ran Out of Food in the Last Year: Never true  Transportation Needs: No Transportation  Needs (09/23/2022)   PRAPARE - Administrator, Civil Service (Medical): No    Lack of Transportation (Non-Medical): No  Physical Activity: Insufficiently Active (09/23/2022)   Exercise Vital Sign    Days of Exercise per Week: 1 day    Minutes of Exercise per Session: 30 min  Stress: No Stress Concern Present (09/23/2022)   [employer] of Occupational Health - Occupational Stress Questionnaire    Feeling of Stress : Not at all  Social Connections: Unknown (09/23/2022)   Social Connection and Isolation Panel [NHANES]    Frequency of Communication with Friends and Family: More than three times a week    Frequency of Social Gatherings with Friends and Family: Once a week    Attends Religious Services: Patient declined    Database administrator or Organizations: No    Attends Banker Meetings: Never    Marital Status: Never married    Allergies: Allergies  Allergen Reactions   Sulfa Antibiotics Rash    Outpatient Meds: Current Outpatient Medications  Medication Sig Dispense Refill   Calcium-Magnesium-Vitamin D (CALCIUM MAGNESIUM PO) Take by mouth daily.     JARDIANCE 10 MG TABS tablet TAKE 1 TABLET BY MOUTH DAILY BEFORE BREAKFAST. 90 tablet 3   latanoprost (XALATAN) 0.005 % ophthalmic solution      losartan (COZAAR) 100 MG tablet TAKE 1 TABLET BY MOUTH EVERY DAY 90 tablet 3   metFORMIN (GLUCOPHAGE) 1000 MG tablet TAKE 1 TABLET BY MOUTH 2 (TWO) TIMES DAILY WITH A MEAL. 180 tablet 3   timolol (TIMOPTIC) 0.5 % ophthalmic solution Place 1 drop into both eyes at bedtime.     co-enzyme Q-10 30 MG capsule Take 30 mg by mouth 3 (three) times daily.       fish oil-omega-3 fatty acids 1000 MG capsule Take 2 g by mouth daily.       No current facility-administered medications for this visit.      ___________________________________________________________________ Objective   Exam:  BP (!) 148/82   Pulse 94   Wt 139 lb (63 kg)   BMI 25.42 kg/m  Wt Readings  from Last 3 Encounters:  12/25/22 139 lb (63 kg)  09/24/22 135 lb (61.2 kg)  01/15/22 145 lb (65.8 kg)   I observed that she seems to have relatively frequent head and neck movements, but she tells me that she had not noticed it and does not think that it is an issue. General: well-appearing   Eyes: sclera anicteric, no redness ENT: oral mucosa moist without lesions, no cervical or supraclavicular lymphadenopathy.  Normal vocal quality.  No palpable thyromegaly CV: RRR, no JVD, no peripheral edema Resp: clear to auscultation bilaterally, normal RR and effort noted GI: soft, no tenderness, with active bowel sounds. No guarding or palpable organomegaly noted. Skin; warm and dry, no rash or jaundice noted Neuro: awake, alert and oriented x 3. Normal gross motor function and fluent speech Prominent upper thoracic spine  hump Labs:   Radiologic Studies:  Assessment:  Pharyngoesophageal dysphagia - Plan: Ambulatory referral to Gastroenterology  It is difficult to determine if her reported symptoms are something within the proximal esophagus or possibly pharyngeal/cricopharyngeal dysfunction.  She has a prominent upper thoracic spine hump, so I wonder if there is some degree of cervical or thoracic arthritis that may be causing some extrinsic compression of the cricopharyngeus.  As proximal as the symptoms are described around the sternal notch, could be a esophageal web.  Given its location and relative paucity of GERD symptoms, it seems less likely to be reflux related stricture.  Slow progression of symptoms speaks against neoplasia.  Plan: -EGD recommended with possible dilation if a finding amenable that is discovered.  She was agreeable after discussion of procedure and risks.  The benefits and risks of the planned procedure were described in detail with the patient or (when appropriate) their health care proxy.  Risks were outlined as including, but not limited to, bleeding, infection,  perforation, adverse medication reaction leading to cardiac or pulmonary decompensation, pancreatitis (if ERCP).  The limitation of incomplete mucosal visualization was also discussed.  No guarantees or warranties were given.  If no clear explanation and EGD, barium study with liquid and tablet to follow.   Thank you for the courtesy of this consult.  Please call me with any questions or concerns.   - Amada Jupiter, MD    [facility]   Ladona Mow M Kadhim,acting as a scribe for Charlie Pitter III, MD.,have documented all relevant documentation on the behalf of Sherrilyn Rist, MD,as directed by  Sherrilyn Rist, MD while in the presence of Sherrilyn Rist, MD.   Marvis Repress III, MD, have reviewed all documentation for this visit. The documentation on 12/25/22 for the exam, diagnosis, procedures, and orders are all accurate and complete.   CC: Referring provider noted above

## 2022-12-25 NOTE — Progress Notes (Deleted)
[facility] Consult Note:  History: Carly Moore 12/25/2022  Referring provider: Georgina Quint, MD  Reason for consult/chief complaint: Dysphagia (Having issues swallowing, takes longer to eat and chew thoroughly, can't swallow capsules either, cough a lot of debris up as well. )   Subjective  HPI:  ***   ROS:  Review of Systems   Past Medical History: Past Medical History:  Diagnosis Date   Diabetes mellitus without complication (HCC)    Phreesia 09/13/2019   Diabetes type 2, controlled (HCC)    Elevated cholesterol    Glaucoma    Hyperlipidemia    Hypertension    Phreesia 09/13/2019   Presbyopia    UTI (urinary tract infection)      Past Surgical History: Past Surgical History:  Procedure Laterality Date   EYE SURGERY N/A    Phreesia 09/13/2019     Family History: Family History  Problem Relation Age of Onset   Heart disease Mother    Cancer Father    Diabetes Father    Alcohol abuse Brother     Social History: Social History   Socioeconomic History   Marital status: Single    Spouse name: Not on file   Number of children: 1   Years of education: Not on file   Highest education level: Associate degree: occupational, Scientist, product/process development, or vocational program  Occupational History   Occupation: Engineer, agricultural   Occupation: retired  Tobacco Use   Smoking status: Never   Smokeless tobacco: Never  Vaping Use   Vaping status: Never Used  Substance and Sexual Activity   Alcohol use: Yes   Drug use: No   Sexual activity: Not Currently  Other Topics Concern   Not on file  Social History Narrative   Single   1 son   Social Determinants of Health   Financial Resource Strain: Low Risk  (09/23/2022)   Overall Financial Resource Strain (CARDIA)    Difficulty of Paying Living Expenses: Not hard at all  Food Insecurity: No Food Insecurity (09/23/2022)   Hunger Vital Sign    Worried About Running Out of Food in the Last  Year: Never true    Ran Out of Food in the Last Year: Never true  Transportation Needs: No Transportation Needs (09/23/2022)   PRAPARE - Administrator, Civil Service (Medical): No    Lack of Transportation (Non-Medical): No  Physical Activity: Insufficiently Active (09/23/2022)   Exercise Vital Sign    Days of Exercise per Week: 1 day    Minutes of Exercise per Session: 30 min  Stress: No Stress Concern Present (09/23/2022)   [employer] of Occupational Health - Occupational Stress Questionnaire    Feeling of Stress : Not at all  Social Connections: Unknown (09/23/2022)   Social Connection and Isolation Panel [NHANES]    Frequency of Communication with Friends and Family: More than three times a week    Frequency of Social Gatherings with Friends and Family: Once a week    Attends Religious Services: Patient declined    Database administrator or Organizations: No    Attends Banker Meetings: Never    Marital Status: Never married    Allergies: Allergies  Allergen Reactions   Sulfa Antibiotics Rash    Outpatient Meds: Current Outpatient Medications  Medication Sig Dispense Refill   Calcium-Magnesium-Vitamin D (CALCIUM MAGNESIUM PO) Take by mouth daily.     JARDIANCE 10 MG TABS tablet TAKE 1 TABLET BY MOUTH DAILY  BEFORE BREAKFAST. 90 tablet 3   latanoprost (XALATAN) 0.005 % ophthalmic solution      losartan (COZAAR) 100 MG tablet TAKE 1 TABLET BY MOUTH EVERY DAY 90 tablet 3   metFORMIN (GLUCOPHAGE) 1000 MG tablet TAKE 1 TABLET BY MOUTH 2 (TWO) TIMES DAILY WITH A MEAL. 180 tablet 3   timolol (TIMOPTIC) 0.5 % ophthalmic solution Place 1 drop into both eyes at bedtime.     co-enzyme Q-10 30 MG capsule Take 30 mg by mouth 3 (three) times daily.       fish oil-omega-3 fatty acids 1000 MG capsule Take 2 g by mouth daily.       No current facility-administered medications for this visit.       ___________________________________________________________________ Objective   Exam:  BP (!) 148/82   Pulse 94   Wt 139 lb (63 kg)   BMI 25.42 kg/m  Wt Readings from Last 3 Encounters:  12/25/22 139 lb (63 kg)  09/24/22 135 lb (61.2 kg)  01/15/22 145 lb (65.8 kg)    General: ***  Eyes: sclera anicteric, no redness ENT: oral mucosa moist without lesions, no cervical or supraclavicular lymphadenopathy CV: ***, no JVD, no peripheral edema Resp: clear to auscultation bilaterally, normal RR and effort noted GI: soft, *** tenderness, with active bowel sounds. No guarding or palpable organomegaly noted. Skin; warm and dry, no rash or jaundice noted Neuro: awake, alert and oriented x 3. Normal gross motor function and fluent speech  Labs:  ***  Radiologic Studies:  ***  Assessment: No diagnosis found.  ***  Plan:  ***  Thank you for the courtesy of this consult.  Please call me with any questions or concerns.  Charlie Pitter III  CC: Referring provider noted above

## 2022-12-25 NOTE — Patient Instructions (Signed)
_______________________________________________________  If your blood pressure at your visit was 140/90 or greater, please contact your primary care physician to follow up on this.  _______________________________________________________  If you are age 78 or older, your body mass index should be between 23-30. Your Body mass index is 25.42 kg/m. If this is out of the aforementioned range listed, please consider follow up with your Primary Care Provider.  If you are age 45 or younger, your body mass index should be between 19-25. Your Body mass index is 25.42 kg/m. If this is out of the aformentioned range listed, please consider follow up with your Primary Care Provider.   ________________________________________________________  The [facility] providers would like to encourage you to use [facility] to communicate with providers for non-urgent requests or questions.  Due to long hold times on the telephone, sending your provider a message by [facility] may be a faster and more efficient way to get a response.  Please allow 48 business hours for a response.  Please remember that this is for non-urgent requests.  _______________________________________________________  Bonita Quin have been scheduled for an endoscopy. Please follow written instructions given to you at your visit today.  If you use inhalers (even only as needed), please bring them with you on the day of your procedure.  If you take any of the following medications, they will need to be adjusted prior to your procedure:   DO NOT TAKE 7 DAYS PRIOR TO TEST- Trulicity (dulaglutide) Ozempic, Wegovy (semaglutide) Mounjaro (tirzepatide) Bydureon Bcise (exanatide extended release)  DO NOT TAKE 1 DAY PRIOR TO YOUR TEST Rybelsus (semaglutide) Adlyxin (lixisenatide) Victoza (liraglutide) Byetta (exanatide) ___________________________________________________________________________    Due to recent changes in healthcare laws, you may see  the results of your imaging and laboratory studies on MyChart before your provider has had a chance to review them.  We understand that in some cases there may be results that are confusing or concerning to you. Not all laboratory results come back in the same time frame and the provider may be waiting for multiple results in order to interpret others.  Please give [place] 48 hours in order for your provider to thoroughly review all the results before contacting the office for clarification of your results.   It was a pleasure to see you today!  Thank you for trusting me with your gastrointestinal care!

## 2022-12-27 ENCOUNTER — Encounter: Payer: Self-pay | Admitting: Gastroenterology

## 2023-01-07 ENCOUNTER — Encounter: Payer: Self-pay | Admitting: Gastroenterology

## 2023-01-07 ENCOUNTER — Ambulatory Visit: Payer: Medicare HMO | Admitting: Gastroenterology

## 2023-01-07 VITALS — BP 100/62 | HR 71 | Temp 95.5°F | Resp 18 | Ht 60.0 in | Wt 139.0 lb

## 2023-01-07 DIAGNOSIS — K2210 Ulcer of esophagus without bleeding: Secondary | ICD-10-CM | POA: Diagnosis not present

## 2023-01-07 DIAGNOSIS — K222 Esophageal obstruction: Secondary | ICD-10-CM | POA: Diagnosis not present

## 2023-01-07 DIAGNOSIS — I10 Essential (primary) hypertension: Secondary | ICD-10-CM | POA: Diagnosis not present

## 2023-01-07 DIAGNOSIS — R1314 Dysphagia, pharyngoesophageal phase: Secondary | ICD-10-CM

## 2023-01-07 DIAGNOSIS — E119 Type 2 diabetes mellitus without complications: Secondary | ICD-10-CM | POA: Diagnosis not present

## 2023-01-07 MED ORDER — SODIUM CHLORIDE 0.9 % IV SOLN: 500.0000 mL | Freq: Once | INTRAVENOUS | Status: DC

## 2023-01-07 MED ORDER — FIRST-LANSOPRAZOLE 3 MG/ML PO SUSP: 30.0000 mg | Freq: Two times a day (BID) | ORAL | 0 refills | Status: DC

## 2023-01-07 NOTE — Progress Notes (Signed)
No changes to clinical history since GI office visit on 12/25/22.  The patient is appropriate for an endoscopic procedure in the ambulatory setting.  - Amada Jupiter, MD

## 2023-01-07 NOTE — Progress Notes (Signed)
Sedate, gd SR, tolerated procedure well, VSS, report to RN 

## 2023-01-07 NOTE — Op Note (Signed)
[facility] Patient Name: Carly Moore Procedure Date: 01/07/2023 1:59 PM MRN: [id number] Endoscopist: Sherilyn Cooter L. Myrtie Neither , MD, [id number] Age: 77 Referring MD:  Date of Birth: 1945/10/23 Gender: Female Account #: [id number] Procedure:                Upper GI endoscopy Indications:              Pharyngeal/esophageal phase dysphagia Medicines:                Monitored Anesthesia Care Procedure:                Pre-Anesthesia Assessment:                           - Prior to the procedure, a History and Physical                            was performed, and patient medications and                            allergies were reviewed. The patient's tolerance of                            previous anesthesia was also reviewed. The risks                            and benefits of the procedure and the sedation                            options and risks were discussed with the patient.                            All questions were answered, and informed consent                            was obtained. Prior Anticoagulants: The patient has                            taken no anticoagulant or antiplatelet agents. ASA                            Grade Assessment: II - A patient with mild systemic                            disease. After reviewing the risks and benefits,                            the patient was deemed in satisfactory condition to                            undergo the procedure.                           After obtaining informed consent, the endoscope was  passed under direct vision. Throughout the                            procedure, the patient's blood pressure, pulse, and                            oxygen saturations were monitored continuously. The                            GIF [id number] #[id number] was introduced through the                            mouth, and advanced to the upper third of                            esophagus. The upper GI  endoscopy was accomplished                            without difficulty. The patient tolerated the                            procedure well. Scope In: Scope Out: Findings:                 One benign-appearing, intrinsic severe stenosis was                            found 21 cm from the incisors. Chronic ulceration                            of about 3/4 circumference. This stenosis measured                            3 mm (inner diameter).Length could not be                            determined because scope unable to pass. The                            stenosis was not traversed. Therefore, remainder of                            UGI tract could not be examined. Complications:            No immediate complications. Estimated Blood Loss:     Estimated blood loss: none. Impression:               - Benign-appearing esophageal stenosis.                           - No specimens collected.                           This is most likely a GERD-induced stricture  despite relative paucity of reflux symptoms.                            Patient has not had head/neck/chest radiation                            treatment. Recommendation:           - Patient has a contact number available for                            emergencies. The signs and symptoms of potential                            delayed complications were discussed with the                            patient. Return to normal activities tomorrow.                            Written discharge instructions were provided to the                            patient.                           - Soft diet.                           - Medication:                           Lansoprazole 3mg /ml solution                           10ml (30mg ) twice daily                           Dispense 30 day supply, zero refill                           Repeat EGD will be scheduled in hospital outpatient                             endoscopy department for wire-guided bougie or                            balloon dilation (under fluoroscopy if necessary). Shabree Tebbetts L. Myrtie Neither, MD 01/07/2023 2:25:46 PM This report has been signed electronically.

## 2023-01-07 NOTE — Patient Instructions (Signed)
Change diet to soft diet Continue present medications Dr Myrtie Neither' office nurse will contact you with the date of the hospital EGD  Handouts/information given for soft diet, dysphagia, esophageal stricture, GERD  YOU HAD AN ENDOSCOPIC PROCEDURE TODAY AT THE [facility]:   Refer to the procedure report that was given to you for any specific questions about what was found during the examination.  If the procedure report does not answer your questions, please call your gastroenterologist to clarify.  If you requested that your care partner not be given the details of your procedure findings, then the procedure report has been included in a sealed envelope for you to review at your convenience later.  YOU SHOULD EXPECT: Some feelings of bloating in the abdomen. Passage of more gas than usual.  Walking can help get rid of the air that was put into your GI tract during the procedure and reduce the bloating. If you had a lower endoscopy (such as a colonoscopy or flexible sigmoidoscopy) you may notice spotting of blood in your stool or on the toilet paper. If you underwent a bowel prep for your procedure, you may not have a normal bowel movement for a few days.  Please Note:  You might notice some irritation and congestion in your nose or some drainage.  This is from the oxygen used during your procedure.  There is no need for concern and it should clear up in a day or so.  SYMPTOMS TO REPORT IMMEDIATELY:  Following upper endoscopy (EGD)  Vomiting of blood or coffee ground material  New chest pain or pain under the shoulder blades  Painful or persistently difficult swallowing  New shortness of breath  Fever of 100F or higher  Black, tarry-looking stools  For urgent or emergent issues, a gastroenterologist can be reached at any hour by calling [phone]. Do not use MyChart messaging for urgent concerns.   DIET:  We do recommend a small meal at first, but then soft diet.  Drink plenty  of fluids but you should avoid alcoholic beverages for 24 hours.  ACTIVITY:  You should plan to take it easy for the rest of today and you should NOT DRIVE or use heavy machinery until tomorrow (because of the sedation medicines used during the test).    FOLLOW UP: Our staff will call the number listed on your records the next business day following your procedure.  We will call around 7:15- 8:00 am to check on you and address any questions or concerns that you may have regarding the information given to you following your procedure. If we do not reach you, we will leave a message.     SIGNATURES/CONFIDENTIALITY: You and/or your care partner have signed paperwork which will be entered into your electronic medical record.  These signatures attest to the fact that that the information above on your After Visit Summary has been reviewed and is understood.  Full responsibility of the confidentiality of this discharge information lies with you and/or your care-partner.

## 2023-01-07 NOTE — Progress Notes (Signed)
Pt's states no medical or surgical changes since previsit or office visit. 

## 2023-01-08 ENCOUNTER — Telehealth: Payer: Self-pay | Admitting: *Deleted

## 2023-01-08 ENCOUNTER — Other Ambulatory Visit: Payer: Self-pay

## 2023-01-08 ENCOUNTER — Telehealth: Payer: Self-pay

## 2023-01-08 DIAGNOSIS — K222 Esophageal obstruction: Secondary | ICD-10-CM

## 2023-01-08 DIAGNOSIS — R1314 Dysphagia, pharyngoesophageal phase: Secondary | ICD-10-CM

## 2023-01-08 NOTE — Telephone Encounter (Signed)
Left message on f/u call

## 2023-01-08 NOTE — Telephone Encounter (Signed)
Pt scheduled for an  EGD on 02/06/2023 at 10:15 AM  at [facility] with Dr. Myrtie Neither. Case ID [id number] Ambulatory referral to GI placed in Epic.  Left message for pt to call back   Pt will need prep instructions.

## 2023-01-08 NOTE — Telephone Encounter (Signed)
-----   Message from Charlie Pitter III sent at 01/07/2023  2:27 PM EDT ----- Regarding: hold hospital scope slot Viviann Spare,    Please put this patient in for an EGD in my Nov 14th [place] endoscopy block. Dysphagia, esophageal stricture.  Needs fluoroscopy.  - HDanis

## 2023-01-09 ENCOUNTER — Other Ambulatory Visit: Payer: Self-pay

## 2023-01-09 ENCOUNTER — Other Ambulatory Visit: Payer: Self-pay | Admitting: Gastroenterology

## 2023-01-09 ENCOUNTER — Telehealth: Payer: Self-pay

## 2023-01-09 DIAGNOSIS — R1314 Dysphagia, pharyngoesophageal phase: Secondary | ICD-10-CM

## 2023-01-09 DIAGNOSIS — K222 Esophageal obstruction: Secondary | ICD-10-CM

## 2023-01-09 NOTE — Telephone Encounter (Signed)
PT returning call. Please advise.

## 2023-01-09 NOTE — Telephone Encounter (Signed)
This patient has a severe esophageal stricture precluding pill form of a PPI medicine.  Pharmacy says cannot obtain liquid lansoprazole as prescribed after EGD.  We need this patient's pharmacist to tell us which PPI in liquid or dissovible tablet form they will be able to obtain.  When they give [place] that information, we can send the appropriate prescription.   - H. Danis

## 2023-01-09 NOTE — Telephone Encounter (Signed)
Pt was notified that there was a cancellation on 01/16/2023. Pt agreed to be rescheduled.   Pt was scheduled on 01/16/2023 at 10:30 AM at [facility]. Pt made aware. Case ID [id number] Ambulatory referral to GI placed in Epic.  Prep Instructions were created and sent to pt via my chart. Pt made aware.   Pt verbalized understanding with all questions answered.  Routed as [employer]

## 2023-01-09 NOTE — Telephone Encounter (Signed)
Pt made aware of Dr. Myrtie Neither recommendations: Pt was ordered and scheduled for an EGD on 02/06/2023 at 10:15 AM at [facility]. Pt made aware.  Ambulatory referral to GI placed in Epic.  Prep instructions were sent to pt via my chart. Pt made aware.  Pt verbalized understanding with all questions answered.

## 2023-01-13 ENCOUNTER — Other Ambulatory Visit: Payer: Self-pay

## 2023-01-13 MED ORDER — LANSOPRAZOLE 3 MG/ML SUSP: 30.0000 mg | Freq: Two times a day (BID) | ORAL | 1 refills | Status: DC

## 2023-01-14 ENCOUNTER — Encounter (HOSPITAL_COMMUNITY): Payer: Self-pay | Admitting: Gastroenterology

## 2023-01-15 NOTE — Anesthesia Preprocedure Evaluation (Signed)
Anesthesia Evaluation    Reviewed: Allergy & Precautions, Patient's Chart, lab work & pertinent test results  History of Anesthesia Complications Negative for: history of anesthetic complications  Airway        Dental   Pulmonary neg pulmonary ROS          Cardiovascular hypertension, Pt. on medications      Neuro/Psych negative neurological ROS  negative psych ROS   GI/Hepatic negative GI ROS, Neg liver ROS,,,  Endo/Other  diabetes, Type 2, Oral Hypoglycemic Agents    Renal/GU CRFRenal disease     Musculoskeletal negative musculoskeletal ROS (+)    Abdominal   Peds  Hematology negative hematology ROS (+)   Anesthesia Other Findings   Reproductive/Obstetrics                             Anesthesia Physical Anesthesia Plan  ASA: 3  Anesthesia Plan: MAC   Post-op Pain Management: Minimal or no pain anticipated   Induction:   PONV Risk Score and Plan: 2 and Propofol infusion and Treatment may vary due to age or medical condition  Airway Management Planned: Nasal Cannula and Natural Airway  Additional Equipment: None  Intra-op Plan:   Post-operative Plan:   Informed Consent:   Plan Discussed with: CRNA and Anesthesiologist  Anesthesia Plan Comments: (Cancelled in preop due to uncontrolled hypertension)       Anesthesia Quick Evaluation

## 2023-01-16 ENCOUNTER — Ambulatory Visit (HOSPITAL_COMMUNITY): Payer: Self-pay | Admitting: Anesthesiology

## 2023-01-16 ENCOUNTER — Other Ambulatory Visit: Payer: Self-pay

## 2023-01-16 ENCOUNTER — Ambulatory Visit (HOSPITAL_COMMUNITY): Payer: Medicare HMO | Admitting: Anesthesiology

## 2023-01-16 ENCOUNTER — Encounter (HOSPITAL_COMMUNITY): Payer: Self-pay | Admitting: Gastroenterology

## 2023-01-16 ENCOUNTER — Ambulatory Visit (HOSPITAL_COMMUNITY)
Admission: RE | Admit: 2023-01-16 | Discharge: 2023-01-16 | Disposition: A | Discharge status: Still In Hospital | Payer: Medicare HMO | Source: Home / Self Care | Attending: Gastroenterology | Admitting: Gastroenterology

## 2023-01-16 ENCOUNTER — Encounter (HOSPITAL_COMMUNITY): Payer: Self-pay

## 2023-01-16 ENCOUNTER — Inpatient Hospital Stay (HOSPITAL_COMMUNITY)
Admission: EM | Admit: 2023-01-16 | Discharge: 2023-01-18 | DRG: 305 | Disposition: A | Discharge status: Home / Self Care | Payer: Medicare HMO | Attending: Family Medicine | Admitting: Family Medicine

## 2023-01-16 ENCOUNTER — Encounter (HOSPITAL_COMMUNITY)
Admission: RE | Disposition: A | Discharge status: Still In Hospital | Payer: Self-pay | Source: Home / Self Care | Attending: Gastroenterology

## 2023-01-16 DIAGNOSIS — I161 Hypertensive emergency: Secondary | ICD-10-CM | POA: Diagnosis not present

## 2023-01-16 DIAGNOSIS — R1310 Dysphagia, unspecified: Secondary | ICD-10-CM | POA: Insufficient documentation

## 2023-01-16 DIAGNOSIS — I10 Essential (primary) hypertension: Principal | ICD-10-CM

## 2023-01-16 DIAGNOSIS — Z811 Family history of alcohol abuse and dependence: Secondary | ICD-10-CM

## 2023-01-16 DIAGNOSIS — I160 Hypertensive urgency: Secondary | ICD-10-CM | POA: Diagnosis present

## 2023-01-16 DIAGNOSIS — K2100 Gastro-esophageal reflux disease with esophagitis, without bleeding: Secondary | ICD-10-CM | POA: Diagnosis present

## 2023-01-16 DIAGNOSIS — Z833 Family history of diabetes mellitus: Secondary | ICD-10-CM

## 2023-01-16 DIAGNOSIS — K2270 Barrett's esophagus without dysplasia: Secondary | ICD-10-CM | POA: Diagnosis present

## 2023-01-16 DIAGNOSIS — E7800 Pure hypercholesterolemia, unspecified: Secondary | ICD-10-CM | POA: Diagnosis present

## 2023-01-16 DIAGNOSIS — Z882 Allergy status to sulfonamides status: Secondary | ICD-10-CM

## 2023-01-16 DIAGNOSIS — Z7984 Long term (current) use of oral hypoglycemic drugs: Secondary | ICD-10-CM

## 2023-01-16 DIAGNOSIS — N1831 Chronic kidney disease, stage 3a: Secondary | ICD-10-CM | POA: Diagnosis present

## 2023-01-16 DIAGNOSIS — Z538 Procedure and treatment not carried out for other reasons: Secondary | ICD-10-CM | POA: Insufficient documentation

## 2023-01-16 DIAGNOSIS — K222 Esophageal obstruction: Secondary | ICD-10-CM | POA: Diagnosis present

## 2023-01-16 DIAGNOSIS — E1122 Type 2 diabetes mellitus with diabetic chronic kidney disease: Secondary | ICD-10-CM | POA: Diagnosis present

## 2023-01-16 DIAGNOSIS — Z79899 Other long term (current) drug therapy: Secondary | ICD-10-CM

## 2023-01-16 DIAGNOSIS — K449 Diaphragmatic hernia without obstruction or gangrene: Secondary | ICD-10-CM | POA: Diagnosis present

## 2023-01-16 DIAGNOSIS — I152 Hypertension secondary to endocrine disorders: Secondary | ICD-10-CM | POA: Diagnosis present

## 2023-01-16 DIAGNOSIS — Z8249 Family history of ischemic heart disease and other diseases of the circulatory system: Secondary | ICD-10-CM

## 2023-01-16 DIAGNOSIS — Z91148 Patient's other noncompliance with medication regimen for other reason: Secondary | ICD-10-CM

## 2023-01-16 DIAGNOSIS — E1169 Type 2 diabetes mellitus with other specified complication: Secondary | ICD-10-CM | POA: Diagnosis present

## 2023-01-16 DIAGNOSIS — E1165 Type 2 diabetes mellitus with hyperglycemia: Secondary | ICD-10-CM | POA: Diagnosis present

## 2023-01-16 LAB — CBC
HCT: 47.7 % — ABNORMAL HIGH (ref 36.0–46.0)
Hemoglobin: 16.2 g/dL — ABNORMAL HIGH (ref 12.0–15.0)
MCH: 31.9 pg (ref 26.0–34.0)
MCHC: 34 g/dL (ref 30.0–36.0)
MCV: 93.9 fL (ref 80.0–100.0)
Platelets: 238 10*3/uL (ref 150–400)
RBC: 5.08 MIL/uL (ref 3.87–5.11)
RDW: 13 % (ref 11.5–15.5)
WBC: 6.1 10*3/uL (ref 4.0–10.5)
nRBC: 0 % (ref 0.0–0.2)

## 2023-01-16 LAB — BASIC METABOLIC PANEL
Anion gap: 10 (ref 5–15)
BUN: 15 mg/dL (ref 8–23)
CO2: 25 mmol/L (ref 22–32)
Calcium: 9.2 mg/dL (ref 8.9–10.3)
Chloride: 101 mmol/L (ref 98–111)
Creatinine, Ser: 1.08 mg/dL — ABNORMAL HIGH (ref 0.44–1.00)
GFR, Estimated: 53 mL/min — ABNORMAL LOW (ref >60)
Glucose, Bld: 183 mg/dL — ABNORMAL HIGH (ref 70–99)
Potassium: 4.2 mmol/L (ref 3.5–5.1)
Sodium: 136 mmol/L (ref 135–145)

## 2023-01-16 LAB — CBG MONITORING, ED: Glucose-Capillary: 191 mg/dL — ABNORMAL HIGH (ref 70–99)

## 2023-01-16 LAB — GLUCOSE, CAPILLARY
Glucose-Capillary: 256 mg/dL — ABNORMAL HIGH (ref 70–99)
Glucose-Capillary: 169 mg/dL — ABNORMAL HIGH (ref 70–99)

## 2023-01-16 SURGERY — CANCELLED PROCEDURE
Anesthesia: Monitor Anesthesia Care

## 2023-01-16 MED ORDER — ALBUTEROL SULFATE (2.5 MG/3ML) 0.083% IN NEBU: 2.5000 mg | INHALATION_SOLUTION | RESPIRATORY_TRACT | Status: DC | PRN

## 2023-01-16 MED ORDER — INSULIN ASPART 100 UNIT/ML IJ SOLN
0.0000 [IU] | Freq: Every day | INTRAMUSCULAR | Status: DC
  Filled 2023-01-16: qty 0.05

## 2023-01-16 MED ORDER — ONDANSETRON HCL 4 MG/2ML IJ SOLN: 4.0000 mg | Freq: Four times a day (QID) | INTRAMUSCULAR | Status: DC | PRN

## 2023-01-16 MED ORDER — ACETAMINOPHEN 650 MG RE SUPP: 650.0000 mg | Freq: Four times a day (QID) | RECTAL | Status: DC | PRN

## 2023-01-16 MED ORDER — LATANOPROST 0.005 % OP SOLN
1.0000 [drp] | Freq: Every day | OPHTHALMIC | Status: DC
  Administered 2023-01-16 – 2023-01-17 (×2): 1 [drp] via OPHTHALMIC
  Filled 2023-01-16: qty 2.5

## 2023-01-16 MED ORDER — HYDRALAZINE HCL 20 MG/ML IJ SOLN
5.0000 mg | Freq: Four times a day (QID) | INTRAMUSCULAR | Status: DC | PRN
  Administered 2023-01-16: 5 mg via INTRAVENOUS
  Filled 2023-01-16: qty 1

## 2023-01-16 MED ORDER — LANSOPRAZOLE 3 MG/ML SUSP: 30.0000 mg | Freq: Two times a day (BID) | ORAL | 0 refills | Status: DC

## 2023-01-16 MED ORDER — ACETAMINOPHEN 325 MG PO TABS: 650.0000 mg | ORAL_TABLET | Freq: Four times a day (QID) | ORAL | Status: DC | PRN

## 2023-01-16 MED ORDER — LOSARTAN POTASSIUM 50 MG PO TABS
100.0000 mg | ORAL_TABLET | Freq: Every day | ORAL | Status: DC
  Administered 2023-01-17: 100 mg via ORAL
  Filled 2023-01-16: qty 2

## 2023-01-16 MED ORDER — ISOSORBIDE DINITRATE 10 MG PO TABS
10.0000 mg | ORAL_TABLET | Freq: Three times a day (TID) | ORAL | Status: DC
  Administered 2023-01-16 – 2023-01-18 (×5): 10 mg via ORAL
  Filled 2023-01-16 (×5): qty 1

## 2023-01-16 MED ORDER — INSULIN ASPART 100 UNIT/ML IJ SOLN
0.0000 [IU] | Freq: Three times a day (TID) | INTRAMUSCULAR | Status: DC
  Administered 2023-01-16: 8 [IU] via SUBCUTANEOUS
  Administered 2023-01-17: 3 [IU] via SUBCUTANEOUS
  Administered 2023-01-17 – 2023-01-18 (×2): 2 [IU] via SUBCUTANEOUS
  Filled 2023-01-16: qty 0.15

## 2023-01-16 MED ORDER — PANTOPRAZOLE SODIUM 40 MG IV SOLR
40.0000 mg | Freq: Two times a day (BID) | INTRAVENOUS | Status: DC
  Administered 2023-01-16 – 2023-01-17 (×3): 40 mg via INTRAVENOUS
  Filled 2023-01-16 (×3): qty 10

## 2023-01-16 MED ORDER — TIMOLOL MALEATE 0.5 % OP SOLN
1.0000 [drp] | Freq: Every day | OPHTHALMIC | Status: DC
  Administered 2023-01-17: 1 [drp] via OPHTHALMIC
  Filled 2023-01-16: qty 5

## 2023-01-16 MED ORDER — ONDANSETRON HCL 4 MG PO TABS: 4.0000 mg | ORAL_TABLET | Freq: Four times a day (QID) | ORAL | Status: DC | PRN

## 2023-01-16 MED ORDER — LABETALOL HCL 5 MG/ML IV SOLN
10.0000 mg | Freq: Once | INTRAVENOUS | Status: AC
  Administered 2023-01-16: 10 mg via INTRAVENOUS
  Filled 2023-01-16: qty 4

## 2023-01-16 MED ORDER — LANSOPRAZOLE 3 MG/ML SUSP: 30.0000 mg | Freq: Two times a day (BID) | ORAL | Status: DC

## 2023-01-16 MED ORDER — LABETALOL HCL 5 MG/ML IV SOLN
10.0000 mg | INTRAVENOUS | Status: DC | PRN
  Administered 2023-01-16 – 2023-01-17 (×2): 10 mg via INTRAVENOUS
  Filled 2023-01-16 (×2): qty 4

## 2023-01-16 MED ORDER — TRAZODONE HCL 50 MG PO TABS: 25.0000 mg | ORAL_TABLET | Freq: Every evening | ORAL | Status: DC | PRN

## 2023-01-16 MED ORDER — ENOXAPARIN SODIUM 40 MG/0.4ML IJ SOSY: 40.0000 mg | PREFILLED_SYRINGE | INTRAMUSCULAR | Status: DC

## 2023-01-16 SURGICAL SUPPLY — 13 items
BLOCK BITE 60FR ADLT L/F BLUE (MISCELLANEOUS) ×2
ELECT REM PT RETURN 9FT ADLT (ELECTROSURGICAL)
FORCEP RJ3 GP 1.8X160 W-NEEDLE (CUTTING FORCEPS)
FORCEPS BIOP RAD 4 LRG CAP 4 (CUTTING FORCEPS)
NEEDLE SCLEROTHERAPY 25GX240 (NEEDLE)
PROBE APC STR FIRE (PROBE)
PROBE INJECTION GOLD (MISCELLANEOUS)
PROBE INJECTION GOLD 7FR (MISCELLANEOUS)
SNARE SHORT THROW 13M SML OVAL (MISCELLANEOUS)
SYR 50ML LL SCALE MARK (SYRINGE)
TUBING ENDO SMARTCAP PENTAX (MISCELLANEOUS) ×4
TUBING IRRIGATION ENDOGATOR (MISCELLANEOUS) ×2
WATER STERILE IRR 1000ML POUR (IV SOLUTION)

## 2023-01-16 NOTE — Consult Note (Addendum)
Consultation  Primary Care Physician:  Georgina Quint, MD Primary Gastroenterologist:  Dr. Myrtie Neither       Reason for Consultation: proximal esophageal stricture.  DOA: 01/16/2023         Hospital Day: 1         HPI:   Carly Moore is a 77 y.o. female with past medical history significant for DM type 2, High cholesterol, CKD, Glaucoma, and high blood pressure.  Patient was admitted into ED for further evaluation for accelerated blood pressure during a outpatient endoscopy with Dr. Myrtie Neither.  They noted her blood pressure was extremely elevated with systolics over 200s.  Work up in ED notable for : BUN 15/creatinine 1.08.  Normal LFTs.  Potassium 4.2.  Hgb 16.2.  WBC 6.1.   Patient was seen by Dr. Myrtie Neither on 10/2 for dysphagia with food and pills but denies dysphagia with liquids.  He planned for EGD at that time with possible dilatation. EGD was scheduled for 10/15 where Dr. Myrtie Neither found a benign appearing intrinsic severe stenosis he could not pass scope through.  He scheduled her today outpatient procedure in hospital with plans to do a wire guided dilatation, but patient had accelerated hypertension and procedure was canceled.    Patient lying in bed.  Reports she was scheduled for endoscopy today for problems with swallowing pills and solids.  She reports this has been going on for several months and severity has worsened as of recent.    After her initial endoscopy on 10/15 Dr. Myrtie Neither prescribed Prevacid suspension however patient states due to cost of medication she was unable to take. She does have history of GERD with some pyrosis.No reports of abdominal pain, nausea, or vomiting. No smoking history.   In regards to her blood pressure patient reports it has been controlled and never known it to be this elevated.  Patient denies headache, visual changes, chest pain, or shortness of breath.  He reports taking her home blood pressure medication this morning and on a daily basis.     She denies undergoing any previous GI testing including colonoscopies or other colorectal cancer screening. No family was present at the time of my evaluation.   Previous GI workup:   01/07/2023 with Dr. Myrtie Neither for Pharyngeal/ esophageal phase dysphagia  Impression: - Benign- appearing esophageal stenosis.  - No specimens collected.  This is most likely a GERD- induced stricture despite relative paucity of reflux symptoms. Patient has not had head/ neck/ chest radiation treatment.  Past Medical History:  Diagnosis Date   Diabetes mellitus without complication (HCC)    Phreesia 09/13/2019   Diabetes type 2, controlled (HCC)    Elevated cholesterol    Glaucoma    Hyperlipidemia    Hypertension    Phreesia 09/13/2019   Presbyopia    UTI (urinary tract infection)     Surgical History:  She  has a past surgical history that includes Eye surgery (N/A). Family History:  Her family history includes Alcohol abuse in her brother; Cancer in her father; Diabetes in her father; Heart disease in her mother. Social History:   reports that she has never smoked. She has never used smokeless tobacco. She reports that she does not currently use alcohol. She reports that she does not use drugs.  Prior to Admission medications   Medication Sig Start Date End Date Taking? Authorizing Provider  Calcium-Magnesium-Vitamin D (CALCIUM MAGNESIUM PO) Take by mouth daily.    [provider]  JARDIANCE 10 MG TABS tablet TAKE 1 TABLET BY MOUTH DAILY BEFORE BREAKFAST. 10/21/22   Georgina Quint, MD  lansoprazole (PREVACID) 3 mg/ml SUSP oral suspension Take 10 mLs (30 mg total) by mouth 2 (two) times daily. 01/16/23   Sherrilyn Rist, MD  latanoprost (XALATAN) 0.005 % ophthalmic solution  02/18/15   [provider]  losartan (COZAAR) 100 MG tablet TAKE 1 TABLET BY MOUTH EVERY DAY 10/21/22   Georgina Quint, MD  metFORMIN (GLUCOPHAGE) 1000 MG tablet TAKE 1 TABLET BY MOUTH 2  (TWO) TIMES DAILY WITH A MEAL. 10/21/22   Georgina Quint, MD  timolol (TIMOPTIC) 0.5 % ophthalmic solution Place 1 drop into both eyes at bedtime. 08/14/22   [provider]    No current facility-administered medications for this encounter.   Current Outpatient Medications  Medication Sig Dispense Refill   Calcium-Magnesium-Vitamin D (CALCIUM MAGNESIUM PO) Take by mouth daily.     JARDIANCE 10 MG TABS tablet TAKE 1 TABLET BY MOUTH DAILY BEFORE BREAKFAST. 90 tablet 3   lansoprazole (PREVACID) 3 mg/ml SUSP oral suspension Take 10 mLs (30 mg total) by mouth 2 (two) times daily. 600 mL 0   latanoprost (XALATAN) 0.005 % ophthalmic solution      losartan (COZAAR) 100 MG tablet TAKE 1 TABLET BY MOUTH EVERY DAY 90 tablet 3   metFORMIN (GLUCOPHAGE) 1000 MG tablet TAKE 1 TABLET BY MOUTH 2 (TWO) TIMES DAILY WITH A MEAL. 180 tablet 3   timolol (TIMOPTIC) 0.5 % ophthalmic solution Place 1 drop into both eyes at bedtime.      Allergies as of 01/16/2023 - Review Complete 01/16/2023  Allergen Reaction Noted   Sulfa antibiotics Rash     Review of Systems:    Constitutional: No weight loss, fever, chills, weakness or fatigue HEENT: Eyes: No change in vision               Ears, Nose, Throat:  No change in hearing or congestion Skin: No rash or itching Cardiovascular: No chest pain, chest pressure or palpitations   Respiratory: No SOB or cough Gastrointestinal: See HPI and otherwise negative Genitourinary: No dysuria or change in urinary frequency Neurological: No headache, dizziness or syncope Musculoskeletal: No new muscle or joint pain Hematologic: No bleeding or bruising Psychiatric: No history of depression or anxiety     Physical Exam:  Vital signs in last 24 hours: Temp:  [98.3 F (36.8 C)-98.4 F (36.9 C)] 98.4 F (36.9 C) (10/24 0930) Pulse Rate:  [70-73] 73 (10/24 0930) Resp:  [18-22] 22 (10/24 0930) BP: (245-248)/(86) 248/86 (10/24 0930) SpO2:  [97 %-98 %] 97 %  (10/24 0930) Weight:  [63 kg] 63 kg (10/24 0925)   Last BM recorded by nurses in past 5 days No data recorded  General:   Pleasant, well developed female in no acute distress Head:  Normocephalic and atraumatic. Eyes: sclerae anicteric,conjunctive  Heart:  regular rate and rhythm, no murmurs or gallops Pulm: Clear anteriorly; no wheezing Abdomen:  Soft, Obese AB, Normal bowel sounds. No tenderness Without guarding and Without rebound, No organomegaly appreciated. Extremities:  Without edema. Msk:  Symmetrical without gross deformities. Peripheral pulses intact.  Neurologic:  Alert and  oriented x4;  No focal deficits.  Skin:   Dry and intact without significant lesions or rashes. Psychiatric:  Cooperative. Normal mood and affect.  LAB RESULTS: Recent Labs    01/16/23 1012  WBC 6.1  HGB 16.2*  HCT 47.7*  PLT 238  BMET Recent Labs    01/16/23 1012  NA 136  K 4.2  CL 101  CO2 25  GLUCOSE 183*  BUN 15  CREATININE 1.08*  CALCIUM 9.2   LFT No results for input(s): "PROT", "ALBUMIN", "AST", "ALT", "ALKPHOS", "BILITOT", "BILIDIR", "IBILI" in the last 72 hours. PT/INR No results for input(s): "LABPROT", "INR" in the last 72 hours.  STUDIES: No results found.    Impression /Plan:   77 year old female patient that presented to outpatient endoscopy center for EGD with wire-guided dilatation noted to have accelerated blood pressures with systolic over 200s.  Patient admitted for further evaluation.  Dysphagia secondary to proximal esophageal stricture on EGD on 10/15, plans to do outpt EGD in hospital today with a wire guided dilatation but patient had accelerated hypertension and procedure was canceled. -Full liquids today, NPO after MN -Plans for endoscopic procedure once blood pressure controlled, hopefully tomorrow. I thoroughly discussed the procedure to include nature, alternatives, benefits, and risks including but not limited to bleeding, perforation, infection,  anesthesia/cardiac and pulmonary complications. Patient provides understanding and gave verbal consent to proceed.    Accelerated hypertension - reports taking losartan at home. -per hospitalist  DM type II- on home metformin and Jardiance -per hospitalist  Active Problems:   * No active hospital problems. *    LOS: 0 days   Thank you for your kind consultation, we will continue to follow.   Muslima Toppins J Nicoya Friel  01/16/2023, 10:51 AM

## 2023-01-16 NOTE — H&P (Signed)
History and Physical  Carly Moore HKV:[id number] DOB: Jun 09, 1945 DOA: 01/16/2023  PCP: Georgina Quint, MD   Chief Complaint: Hypertension  HPI: Carly Moore is a 77 y.o. female with medical history significant for hypertension, type 2 diabetes and recently diagnosed proximal esophageal stricture who presented for upper endoscopy and dilation today and is being admitted to the hospital with hypertensive urgency.  Patient states that her blood pressure usually pretty well-controlled, and that although she has been having trouble with oral intake and regurgitation of food for the last few weeks, she has been able to take her medications.  Recent outpatient notes state that the patient has been intermittently compliant with medications, however patient states that she has been taking her medications and in particular her losartan as prescribed.  She denies any recent illness, no vomiting, fevers, chills, chest pain, headache, or confusion.  She presented for outpatient endoscopy today as planned, but procedure was canceled and the patient was sent to the emergency department due to blood pressure as high as 248/86.  Patient was given a dose of IV labetalol in the emergency department, blood pressure is gradually improving, lab work is unrevealing and hospitalist admission was requested for blood pressure control.  Review of Systems: Please see HPI for pertinent positives and negatives. A complete 10 system review of systems are otherwise negative.  Past Medical History:  Diagnosis Date   Diabetes mellitus without complication (HCC)    Phreesia 09/13/2019   Diabetes type 2, controlled (HCC)    Elevated cholesterol    Glaucoma    Hyperlipidemia    Hypertension    Phreesia 09/13/2019   Presbyopia    UTI (urinary tract infection)    Past Surgical History:  Procedure Laterality Date   EYE SURGERY N/A    Phreesia 09/13/2019    Social History:  reports that she has never smoked. She  has never used smokeless tobacco. She reports that she does not currently use alcohol. She reports that she does not use drugs.   Allergies  Allergen Reactions   Sulfa Antibiotics Rash    Family History  Problem Relation Age of Onset   Heart disease Mother    Cancer Father    Diabetes Father    Alcohol abuse Brother    Colon cancer Neg Hx    Stomach cancer Neg Hx    Rectal cancer Neg Hx    Esophageal cancer Neg Hx      Prior to Admission medications   Medication Sig Start Date End Date Taking? Authorizing Provider  Calcium-Magnesium-Vitamin D (CALCIUM MAGNESIUM PO) Take by mouth daily.    [provider]  JARDIANCE 10 MG TABS tablet TAKE 1 TABLET BY MOUTH DAILY BEFORE BREAKFAST. 10/21/22   Georgina Quint, MD  lansoprazole (PREVACID) 3 mg/ml SUSP oral suspension Take 10 mLs (30 mg total) by mouth 2 (two) times daily. 01/16/23   Sherrilyn Rist, MD  latanoprost (XALATAN) 0.005 % ophthalmic solution  02/18/15   [provider]  losartan (COZAAR) 100 MG tablet TAKE 1 TABLET BY MOUTH EVERY DAY 10/21/22   Georgina Quint, MD  metFORMIN (GLUCOPHAGE) 1000 MG tablet TAKE 1 TABLET BY MOUTH 2 (TWO) TIMES DAILY WITH A MEAL. 10/21/22   Georgina Quint, MD  timolol (TIMOPTIC) 0.5 % ophthalmic solution Place 1 drop into both eyes at bedtime. 08/14/22   [provider]    Physical Exam: BP (!) 198/64   Pulse 63   Temp 98.3 F (36.8 C) (  Oral)   Resp 20   Ht 5' (1.524 m)   Wt 63 kg   SpO2 97%   BMI 27.15 kg/m   General:  Alert, oriented, calm, in no acute distress  Eyes: EOMI, clear conjuctivae, white sclerea Neck: supple, no masses, trachea mildline  Cardiovascular: RRR, no murmurs or rubs, no peripheral edema  Respiratory: clear to auscultation bilaterally, no wheezes, no crackles  Abdomen: soft, nontender, nondistended, normal bowel tones heard  Skin: dry, no rashes  Musculoskeletal: no joint effusions, normal range of motion   Psychiatric: appropriate affect, normal speech  Neurologic: extraocular muscles intact, clear speech, moving all extremities with intact sensorium         Labs on Admission:  Basic Metabolic Panel: Recent Labs  Lab 01/16/23 1012  NA 136  K 4.2  CL 101  CO2 25  GLUCOSE 183*  BUN 15  CREATININE 1.08*  CALCIUM 9.2   Liver Function Tests: No results for input(s): "AST", "ALT", "ALKPHOS", "BILITOT", "PROT", "ALBUMIN" in the last 168 hours. No results for input(s): "LIPASE", "AMYLASE" in the last 168 hours. No results for input(s): "AMMONIA" in the last 168 hours. CBC: Recent Labs  Lab 01/16/23 1012  WBC 6.1  HGB 16.2*  HCT 47.7*  MCV 93.9  PLT 238   Cardiac Enzymes: No results for input(s): "CKTOTAL", "CKMB", "CKMBINDEX", "TROPONINI" in the last 168 hours.  BNP (last 3 results) No results for input(s): "BNP" in the last 8760 hours.  ProBNP (last 3 results) No results for input(s): "PROBNP" in the last 8760 hours.  CBG: No results for input(s): "GLUCAP" in the last 168 hours.  Radiological Exams on Admission: No results found.  Assessment/Plan Soniya Ovens is a 77 y.o. female with medical history significant for hypertension, type 2 diabetes and recently diagnosed proximal esophageal stricture who presented for upper endoscopy and dilation today and is being admitted to the hospital with hypertensive urgency.    Hypertensive urgency-without chest pain, headache, AMS or other concerning symptoms.  Unclear etiology of her hypertension, as the patient states she has been compliant with her home medication.  During her most recent PCP visit this past summer, blood pressure was 138/78.  She denies palpitations, tremor, etc. -Observation admission to telemetry -Continue home losartan -Was given a dose of IV labetalol about 2 hours ago -Continue to monitor blood pressure, IV hydralazine as needed for SBP greater than 160 -If hypertension persists, would consider workup  to rule out primary hyperaldosteronism, pheochromocytoma, etc.  Type 2 diabetes-noted to be not well-controlled, last hemoglobin A1c 8.0 July 2024 -Carb controlled diet when eating -Moderate dose sliding scale  Esophageal stricture-likely related to acid reflux -Full liquid consistency diet for now as tolerated -N.p.o. after midnight for anticipated endoscopy in the morning  DVT prophylaxis: Lovenox     Code Status: Full Code  Consults called: Gastroenterology  Admission status: Observation  Time spent: 46 minutes  Kemiyah Tarazon Sharlette Dense MD [facility] Pager [phone]  If 7PM-7AM, please contact night-coverage [email or web address] Password [email or web address]  01/16/2023, 11:45 AM

## 2023-01-16 NOTE — ED Notes (Signed)
ED TO INPATIENT HANDOFF REPORT  ED Nurse Name and Phone #: Izear Pine  S Name/Age/Gender Etta Grandchild 77 y.o. female Room/Bed: WA02/WA02  Code Status   Code Status: Full Code  Home/SNF/Other Home Patient oriented to: self, place, time, and situation Is this baseline? Yes   Triage Complete: Triage complete  Chief Complaint Hypertensive urgency [I16.0]  Triage Note Pt was in ENDO for an endoscopy and her b/p was elevated. Dr. Caren Macadam canceled procedure and requested pt come to the ED for further evaluation.    Allergies Allergies  Allergen Reactions   Sulfa Antibiotics Rash    Level of Care/Admitting Diagnosis ED Disposition     ED Disposition  Admit   Condition  --   Comment  Hospital Area: [facility] [[id number]]  Level of Care: Telemetry [5]  Admit to tele based on following criteria: Other see comments  Comments: HTN  May place patient in observation at [facility] or [facility] if equivalent level of care is available:: No  Covid Evaluation: Asymptomatic - no recent exposure (last 10 days) testing not required  Diagnosis: Hypertensive urgency [650126]  Admitting Physician: Maryln Gottron [[id number]]  Attending Physician: [facility], MIR [email or web address] [[id number]]          B Medical/Surgery History Past Medical History:  Diagnosis Date   Diabetes mellitus without complication (HCC)    Phreesia 09/13/2019   Diabetes type 2, controlled (HCC)    Elevated cholesterol    Glaucoma    Hyperlipidemia    Hypertension    Phreesia 09/13/2019   Presbyopia    UTI (urinary tract infection)    Past Surgical History:  Procedure Laterality Date   EYE SURGERY N/A    Phreesia 09/13/2019     A IV Location/Drains/Wounds Patient Lines/Drains/Airways Status     Active Line/Drains/Airways     Name Placement date Placement time Site Days   Peripheral IV 01/16/23 20 G 1" Right Antecubital 01/16/23  1010  Antecubital  less than 1             Intake/Output Last 24 hours No intake or output data in the 24 hours ending 01/16/23 1438  Labs/Imaging Results for orders placed or performed during the hospital encounter of 01/16/23 (from the past 48 hour(s))  CBC     Status: Abnormal   Collection Time: 01/16/23 10:12 AM  Result Value Ref Range   WBC 6.1 4.0 - 10.5 K/uL   RBC 5.08 3.87 - 5.11 MIL/uL   Hemoglobin 16.2 (H) 12.0 - 15.0 g/dL   HCT 19.1 (H) 47.8 - 29.5 %   MCV 93.9 80.0 - 100.0 fL   MCH 31.9 26.0 - 34.0 pg   MCHC 34.0 30.0 - 36.0 g/dL   RDW 62.1 30.8 - 65.7 %   Platelets 238 150 - 400 K/uL   nRBC 0.0 0.0 - 0.2 %    Comment: Performed at [facility], [address] [place], [place] [address]  Basic metabolic panel     Status: Abnormal   Collection Time: 01/16/23 10:12 AM  Result Value Ref Range   Sodium 136 135 - 145 mmol/L   Potassium 4.2 3.5 - 5.1 mmol/L   Chloride 101 98 - 111 mmol/L   CO2 25 22 - 32 mmol/L   Glucose, Bld 183 (H) 70 - 99 mg/dL    Comment: Glucose reference range applies only to samples taken after fasting for at least 8 hours.   BUN 15 8 - 23 mg/dL  Creatinine, Ser 1.08 (H) 0.44 - 1.00 mg/dL   Calcium 9.2 8.9 - 32.4 mg/dL   GFR, Estimated 53 (L) >60 mL/min    Comment: (NOTE) Calculated using the CKD-EPI Creatinine Equation (2021)    Anion gap 10 5 - 15    Comment: Performed at [facility], [address] [place], [place] [address]  CBG monitoring, ED     Status: Abnormal   Collection Time: 01/16/23 12:21 PM  Result Value Ref Range   Glucose-Capillary 191 (H) 70 - 99 mg/dL    Comment: Glucose reference range applies only to samples taken after fasting for at least 8 hours.   No results found.  Pending Labs Unresulted Labs (From admission, onward)     Start     Ordered   01/23/23 0500  Creatinine, serum  (enoxaparin (LOVENOX)    CrCl >/= 30 ml/min)  Weekly,   R     Comments: while on enoxaparin therapy    01/16/23 1135   01/16/23 1135   CBC  (enoxaparin (LOVENOX)    CrCl >/= 30 ml/min)  Once,   R       Comments: Baseline for enoxaparin therapy IF NOT ALREADY DRAWN.  Notify MD if PLT < 100 K.    01/16/23 1135   01/16/23 1135  Creatinine, serum  (enoxaparin (LOVENOX)    CrCl >/= 30 ml/min)  Once,   R       Comments: Baseline for enoxaparin therapy IF NOT ALREADY DRAWN.    01/16/23 1135            Vitals/Pain Today's Vitals   01/16/23 0925 01/16/23 0930 01/16/23 1053  BP:  (!) 248/86 (!) 198/64  Pulse:  73 63  Resp:  (!) 22 20  Temp:  98.4 F (36.9 C) 98.3 F (36.8 C)  TempSrc:  Oral Oral  SpO2:  97% 97%  Weight: 63 kg    Height: 5' (1.524 m)    PainSc: 0-No pain      Isolation Precautions No active isolations  Medications Medications  timolol (TIMOPTIC) 0.5 % ophthalmic solution 1 drop (has no administration in time range)  losartan (COZAAR) tablet 100 mg (has no administration in time range)  enoxaparin (LOVENOX) injection 40 mg (has no administration in time range)  insulin aspart (novoLOG) injection 0-15 Units (has no administration in time range)  insulin aspart (novoLOG) injection 0-5 Units (has no administration in time range)  acetaminophen (TYLENOL) tablet 650 mg (has no administration in time range)    Or  acetaminophen (TYLENOL) suppository 650 mg (has no administration in time range)  traZODone (DESYREL) tablet 25 mg (has no administration in time range)  ondansetron (ZOFRAN) tablet 4 mg (has no administration in time range)    Or  ondansetron (ZOFRAN) injection 4 mg (has no administration in time range)  albuterol (PROVENTIL) (2.5 MG/3ML) 0.083% nebulizer solution 2.5 mg (has no administration in time range)  hydrALAZINE (APRESOLINE) injection 5 mg (has no administration in time range)  pantoprazole (PROTONIX) injection 40 mg (has no administration in time range)  labetalol (NORMODYNE) injection 10 mg (10 mg Intravenous Given 01/16/23 1013)    Mobility walks     Focused  Assessments    R Recommendations: See Admitting Provider Note  Report given to:   Additional Notes:

## 2023-01-16 NOTE — Telephone Encounter (Signed)
The original medication that was ordered is carried at [facility]. New order form has been faxed to pharmacy. Pt currently admitted for procedure, will contact patient once discharged home.

## 2023-01-16 NOTE — Consult Note (Signed)
Consultation   Primary Care Physician:  Georgina Quint, MD Primary Gastroenterologist:  Dr. Myrtie Neither       Reason for Consultation: proximal esophageal stricture.  DOA: 01/16/2023         Hospital Day: 1         HPI:   Carly Moore is a 77 y.o. female with past medical history significant for DM type 2, High cholesterol, CKD, Glaucoma, and high blood pressure.  Patient was admitted into ED for further evaluation for accelerated blood pressure during a outpatient endoscopy with Dr. Myrtie Neither.  They noted her blood pressure was extremely elevated with systolics over 200s.   Work up in ED notable for : BUN 15/creatinine 1.08.  Normal LFTs.  Potassium 4.2.  Hgb 16.2.  WBC 6.1.    Patient was seen by Dr. Myrtie Neither on 10/2 for dysphagia with food and pills but denies dysphagia with liquids.  He planned for EGD at that time with possible dilatation. EGD was scheduled for 10/15 where Dr. Myrtie Neither found a benign appearing intrinsic severe stenosis he could not pass scope through.  He scheduled her today outpatient procedure in hospital with plans to do a wire guided dilatation, but patient had accelerated hypertension and procedure was canceled.     Patient lying in bed.  Reports she was scheduled for endoscopy today for problems with swallowing pills and solids.  She reports this has been going on for several months and severity has worsened as of recent.    After her initial endoscopy on 10/15 Dr. Myrtie Neither prescribed Prevacid suspension however patient states due to cost of medication she was unable to take. She does have history of GERD with some pyrosis.No reports of abdominal pain, nausea, or vomiting. No smoking history.   In regards to her blood pressure patient reports it has been controlled and never known it to be this elevated.  Patient denies headache, visual changes, chest pain, or shortness of breath.  He reports taking her home blood pressure medication this morning and on a daily basis.      She denies undergoing any previous GI testing including colonoscopies or other colorectal cancer screening. No family was present at the time of my evaluation.    Previous GI workup:    01/07/2023 with Dr. Myrtie Neither for Pharyngeal/ esophageal phase dysphagia   Impression: - Benign- appearing esophageal stenosis.  - No specimens collected.   This is most likely a GERD- induced stricture despite relative paucity of reflux symptoms. Patient has not had head/ neck/ chest radiation treatment.       Past Medical History:  Diagnosis Date   Diabetes mellitus without complication (HCC)      [employer] 09/13/2019   Diabetes type 2, controlled (HCC)     Elevated cholesterol     Glaucoma     Hyperlipidemia     Hypertension      [employer] 09/13/2019   Presbyopia     UTI (urinary tract infection)        Surgical History:  She  has a past surgical history that includes Eye surgery (N/A). Family History:  Her family history includes Alcohol abuse in her brother; Cancer in her father; Diabetes in her father; Heart disease in her mother. Social History:   reports that she has never smoked. She has never used smokeless tobacco. She reports that she does not currently use alcohol. She reports that she does not use drugs.  Prior to Admission medications   Medication Sig Start Date End Date Taking? Authorizing Provider  Calcium-Magnesium-Vitamin D (CALCIUM MAGNESIUM PO) Take by mouth daily.       [provider]  JARDIANCE 10 MG TABS tablet TAKE 1 TABLET BY MOUTH DAILY BEFORE BREAKFAST. 10/21/22     Georgina Quint, MD  lansoprazole (PREVACID) 3 mg/ml SUSP oral suspension Take 10 mLs (30 mg total) by mouth 2 (two) times daily. 01/16/23     Sherrilyn Rist, MD  latanoprost (XALATAN) 0.005 % ophthalmic solution   02/18/15     [provider]  losartan (COZAAR) 100 MG tablet TAKE 1 TABLET BY MOUTH EVERY DAY 10/21/22     Georgina Quint, MD  metFORMIN  (GLUCOPHAGE) 1000 MG tablet TAKE 1 TABLET BY MOUTH 2 (TWO) TIMES DAILY WITH A MEAL. 10/21/22     Georgina Quint, MD  timolol (TIMOPTIC) 0.5 % ophthalmic solution Place 1 drop into both eyes at bedtime. 08/14/22     [provider]      No current facility-administered medications for this encounter.          Current Outpatient Medications  Medication Sig Dispense Refill   Calcium-Magnesium-Vitamin D (CALCIUM MAGNESIUM PO) Take by mouth daily.       JARDIANCE 10 MG TABS tablet TAKE 1 TABLET BY MOUTH DAILY BEFORE BREAKFAST. 90 tablet 3   lansoprazole (PREVACID) 3 mg/ml SUSP oral suspension Take 10 mLs (30 mg total) by mouth 2 (two) times daily. 600 mL 0   latanoprost (XALATAN) 0.005 % ophthalmic solution         losartan (COZAAR) 100 MG tablet TAKE 1 TABLET BY MOUTH EVERY DAY 90 tablet 3   metFORMIN (GLUCOPHAGE) 1000 MG tablet TAKE 1 TABLET BY MOUTH 2 (TWO) TIMES DAILY WITH A MEAL. 180 tablet 3   timolol (TIMOPTIC) 0.5 % ophthalmic solution Place 1 drop into both eyes at bedtime.               Allergies as of 01/16/2023 - Review Complete 01/16/2023  Allergen Reaction Noted   Sulfa antibiotics Rash        Review of Systems:    Constitutional: No weight loss, fever, chills, weakness or fatigue HEENT: Eyes: No change in vision               Ears, Nose, Throat:  No change in hearing or congestion Skin: No rash or itching Cardiovascular: No chest pain, chest pressure or palpitations   Respiratory: No SOB or cough Gastrointestinal: See HPI and otherwise negative Genitourinary: No dysuria or change in urinary frequency Neurological: No headache, dizziness or syncope Musculoskeletal: No new muscle or joint pain Hematologic: No bleeding or bruising Psychiatric: No history of depression or anxiety       Physical Exam:  Vital signs in last 24 hours: Temp:  [98.3 F (36.8 C)-98.4 F (36.9 C)] 98.4 F (36.9 C) (10/24 0930) Pulse Rate:  [70-73] 73 (10/24 0930) Resp:   [18-22] 22 (10/24 0930) BP: (245-248)/(86) 248/86 (10/24 0930) SpO2:  [97 %-98 %] 97 % (10/24 0930) Weight:  [63 kg] 63 kg (10/24 0925) Last BM recorded by nurses in past 5 days No data recorded   General:   Pleasant, well developed female in no acute distress Head:  Normocephalic and atraumatic. Eyes: sclerae anicteric,conjunctive  Heart:  regular rate and rhythm, no murmurs or gallops Pulm: Clear anteriorly; no wheezing Abdomen:  Soft, Obese AB, Normal bowel sounds. No tenderness Without  guarding and Without rebound, No organomegaly appreciated. Extremities:  Without edema. Msk:  Symmetrical without gross deformities. Peripheral pulses intact.  Neurologic:  Alert and  oriented x4;  No focal deficits.  Skin:   Dry and intact without significant lesions or rashes. Psychiatric:  Cooperative. Normal mood and affect.   LAB RESULTS:    Recent Labs    01/16/23 1012  WBC 6.1  HGB 16.2*  HCT 47.7*  PLT 238    BMET    Recent Labs    01/16/23 1012  NA 136  K 4.2  CL 101  CO2 25  GLUCOSE 183*  BUN 15  CREATININE 1.08*  CALCIUM 9.2    LFT No results for input(s): "PROT", "ALBUMIN", "AST", "ALT", "ALKPHOS", "BILITOT", "BILIDIR", "IBILI" in the last 72 hours. PT/INR No results for input(s): "LABPROT", "INR" in the last 72 hours.   STUDIES: No results found.      Impression /Plan:    77 year old female patient that presented to outpatient endoscopy center for EGD with wire-guided dilatation noted to have accelerated blood pressures with systolic over 200s.  Patient admitted for further evaluation.   Dysphagia secondary to proximal esophageal stricture on EGD on 10/15, plans to do outpt EGD in hospital today with a wire guided dilatation but patient had accelerated hypertension and procedure was canceled. -Full liquids today, NPO after MN -Plans for endoscopic procedure once blood pressure controlled, hopefully tomorrow. I thoroughly discussed the procedure to include  nature, alternatives, benefits, and risks including but not limited to bleeding, perforation, infection, anesthesia/cardiac and pulmonary complications. Patient provides understanding and gave verbal consent to proceed.     Accelerated hypertension - reports taking losartan at home. -per hospitalist   DM type II- on home metformin and Jardiance -per hospitalist   Active Problems:   * No active hospital problems. *      LOS: 0 days    Thank you for your kind consultation, we will continue to follow.     Deanna J May  01/16/2023, 10:51 AM     Attending physician's note   I have taken history, reviewed the chart and examined the patient. I performed a substantive portion of this encounter, including complete performance of at least one of the key components, in conjunction with the APP. I agree with the Advanced Practitioner's note, impression and recommendations.   Pt was seen earlier this afternoon.  Plan to do EGD with wire-guided dilatation in a.m. after blood pressure is under good control. I have discussed risks and benefits with the patient again including small but definite risks of perforation which can require surgery.  Benefits were also discussed.  (Dr Myrtie Neither had also discussed the procedure, risks and benefits).  She is tentatively scheduled for EGD tomorrow at 1 PM   Edman Circle, MD [facility] [phone]

## 2023-01-16 NOTE — H&P (Signed)
This patient is here today for an upper endoscopy with planned wire-guided dilation of a tight proximal esophageal stricture.  She has a history of hypertension and has been taking her antihypertensive medicine regularly.  Systolic blood pressure greater than equal to 230 on 3 checks in the preprocedure area.  She denies headache, visual disturbance, chest pain or dyspnea.  Endoscopic procedure was canceled for today.  She is being transferred to the emergency department (directly to an open bed) for management of her accelerated hypertension. I will also speak with the ED physician about the possibility having this patient admitted to the internal medicine service to facilitate for upper endoscopy for relief of this tight stricture by our inpatient consult team tomorrow.  - H. Myrtie Neither, MD

## 2023-01-16 NOTE — ED Triage Notes (Signed)
Pt was in ENDO for an endoscopy and her b/p was elevated. Dr. Caren Macadam canceled procedure and requested pt come to the ED for further evaluation.

## 2023-01-16 NOTE — H&P (View-Only) (Signed)
Consultation   Primary Care Physician:  Georgina Quint, MD Primary Gastroenterologist:  Dr. Myrtie Neither       Reason for Consultation: proximal esophageal stricture.  DOA: 01/16/2023         Hospital Day: 1         HPI:   Carly Moore is a 77 y.o. female with past medical history significant for DM type 2, High cholesterol, CKD, Glaucoma, and high blood pressure.  Patient was admitted into ED for further evaluation for accelerated blood pressure during a outpatient endoscopy with Dr. Myrtie Neither.  They noted her blood pressure was extremely elevated with systolics over 200s.   Work up in ED notable for : BUN 15/creatinine 1.08.  Normal LFTs.  Potassium 4.2.  Hgb 16.2.  WBC 6.1.    Patient was seen by Dr. Myrtie Neither on 10/2 for dysphagia with food and pills but denies dysphagia with liquids.  He planned for EGD at that time with possible dilatation. EGD was scheduled for 10/15 where Dr. Myrtie Neither found a benign appearing intrinsic severe stenosis he could not pass scope through.  He scheduled her today outpatient procedure in hospital with plans to do a wire guided dilatation, but patient had accelerated hypertension and procedure was canceled.     Patient lying in bed.  Reports she was scheduled for endoscopy today for problems with swallowing pills and solids.  She reports this has been going on for several months and severity has worsened as of recent.    After her initial endoscopy on 10/15 Dr. Myrtie Neither prescribed Prevacid suspension however patient states due to cost of medication she was unable to take. She does have history of GERD with some pyrosis.No reports of abdominal pain, nausea, or vomiting. No smoking history.   In regards to her blood pressure patient reports it has been controlled and never known it to be this elevated.  Patient denies headache, visual changes, chest pain, or shortness of breath.  He reports taking her home blood pressure medication this morning and on a daily basis.      She denies undergoing any previous GI testing including colonoscopies or other colorectal cancer screening. No family was present at the time of my evaluation.    Previous GI workup:    01/07/2023 with Dr. Myrtie Neither for Pharyngeal/ esophageal phase dysphagia   Impression: - Benign- appearing esophageal stenosis.  - No specimens collected.   This is most likely a GERD- induced stricture despite relative paucity of reflux symptoms. Patient has not had head/ neck/ chest radiation treatment.       Past Medical History:  Diagnosis Date   Diabetes mellitus without complication (HCC)      [facility] 09/13/2019   Diabetes type 2, controlled (HCC)     Elevated cholesterol     Glaucoma     Hyperlipidemia     Hypertension      [employer] 09/13/2019   Presbyopia     UTI (urinary tract infection)        Surgical History:  She  has a past surgical history that includes Eye surgery (N/A). Family History:  Her family history includes Alcohol abuse in her brother; Cancer in her father; Diabetes in her father; Heart disease in her mother. Social History:   reports that she has never smoked. She has never used smokeless tobacco. She reports that she does not currently use alcohol. She reports that she does not use drugs.  Prior to Admission medications   Medication Sig Start Date End Date Taking? Authorizing Provider  Calcium-Magnesium-Vitamin D (CALCIUM MAGNESIUM PO) Take by mouth daily.       [provider]  JARDIANCE 10 MG TABS tablet TAKE 1 TABLET BY MOUTH DAILY BEFORE BREAKFAST. 10/21/22     Georgina Quint, MD  lansoprazole (PREVACID) 3 mg/ml SUSP oral suspension Take 10 mLs (30 mg total) by mouth 2 (two) times daily. 01/16/23     Sherrilyn Rist, MD  latanoprost (XALATAN) 0.005 % ophthalmic solution   02/18/15     [provider]  losartan (COZAAR) 100 MG tablet TAKE 1 TABLET BY MOUTH EVERY DAY 10/21/22     Georgina Quint, MD  metFORMIN  (GLUCOPHAGE) 1000 MG tablet TAKE 1 TABLET BY MOUTH 2 (TWO) TIMES DAILY WITH A MEAL. 10/21/22     Georgina Quint, MD  timolol (TIMOPTIC) 0.5 % ophthalmic solution Place 1 drop into both eyes at bedtime. 08/14/22     [provider]      No current facility-administered medications for this encounter.          Current Outpatient Medications  Medication Sig Dispense Refill   Calcium-Magnesium-Vitamin D (CALCIUM MAGNESIUM PO) Take by mouth daily.       JARDIANCE 10 MG TABS tablet TAKE 1 TABLET BY MOUTH DAILY BEFORE BREAKFAST. 90 tablet 3   lansoprazole (PREVACID) 3 mg/ml SUSP oral suspension Take 10 mLs (30 mg total) by mouth 2 (two) times daily. 600 mL 0   latanoprost (XALATAN) 0.005 % ophthalmic solution         losartan (COZAAR) 100 MG tablet TAKE 1 TABLET BY MOUTH EVERY DAY 90 tablet 3   metFORMIN (GLUCOPHAGE) 1000 MG tablet TAKE 1 TABLET BY MOUTH 2 (TWO) TIMES DAILY WITH A MEAL. 180 tablet 3   timolol (TIMOPTIC) 0.5 % ophthalmic solution Place 1 drop into both eyes at bedtime.               Allergies as of 01/16/2023 - Review Complete 01/16/2023  Allergen Reaction Noted   Sulfa antibiotics Rash        Review of Systems:    Constitutional: No weight loss, fever, chills, weakness or fatigue HEENT: Eyes: No change in vision               Ears, Nose, Throat:  No change in hearing or congestion Skin: No rash or itching Cardiovascular: No chest pain, chest pressure or palpitations   Respiratory: No SOB or cough Gastrointestinal: See HPI and otherwise negative Genitourinary: No dysuria or change in urinary frequency Neurological: No headache, dizziness or syncope Musculoskeletal: No new muscle or joint pain Hematologic: No bleeding or bruising Psychiatric: No history of depression or anxiety       Physical Exam:  Vital signs in last 24 hours: Temp:  [98.3 F (36.8 C)-98.4 F (36.9 C)] 98.4 F (36.9 C) (10/24 0930) Pulse Rate:  [70-73] 73 (10/24 0930) Resp:   [18-22] 22 (10/24 0930) BP: (245-248)/(86) 248/86 (10/24 0930) SpO2:  [97 %-98 %] 97 % (10/24 0930) Weight:  [63 kg] 63 kg (10/24 0925) Last BM recorded by nurses in past 5 days No data recorded   General:   Pleasant, well developed female in no acute distress Head:  Normocephalic and atraumatic. Eyes: sclerae anicteric,conjunctive  Heart:  regular rate and rhythm, no murmurs or gallops Pulm: Clear anteriorly; no wheezing Abdomen:  Soft, Obese AB, Normal bowel sounds. No tenderness Without  guarding and Without rebound, No organomegaly appreciated. Extremities:  Without edema. Msk:  Symmetrical without gross deformities. Peripheral pulses intact.  Neurologic:  Alert and  oriented x4;  No focal deficits.  Skin:   Dry and intact without significant lesions or rashes. Psychiatric:  Cooperative. Normal mood and affect.   LAB RESULTS:    Recent Labs    01/16/23 1012  WBC 6.1  HGB 16.2*  HCT 47.7*  PLT 238    BMET    Recent Labs    01/16/23 1012  NA 136  K 4.2  CL 101  CO2 25  GLUCOSE 183*  BUN 15  CREATININE 1.08*  CALCIUM 9.2    LFT No results for input(s): "PROT", "ALBUMIN", "AST", "ALT", "ALKPHOS", "BILITOT", "BILIDIR", "IBILI" in the last 72 hours. PT/INR No results for input(s): "LABPROT", "INR" in the last 72 hours.   STUDIES: No results found.      Impression /Plan:    77 year old female patient that presented to outpatient endoscopy center for EGD with wire-guided dilatation noted to have accelerated blood pressures with systolic over 200s.  Patient admitted for further evaluation.   Dysphagia secondary to proximal esophageal stricture on EGD on 10/15, plans to do outpt EGD in hospital today with a wire guided dilatation but patient had accelerated hypertension and procedure was canceled. -Full liquids today, NPO after MN -Plans for endoscopic procedure once blood pressure controlled, hopefully tomorrow. I thoroughly discussed the procedure to include  nature, alternatives, benefits, and risks including but not limited to bleeding, perforation, infection, anesthesia/cardiac and pulmonary complications. Patient provides understanding and gave verbal consent to proceed.     Accelerated hypertension - reports taking losartan at home. -per hospitalist   DM type II- on home metformin and Jardiance -per hospitalist   Active Problems:   * No active hospital problems. *      LOS: 0 days    Thank you for your kind consultation, we will continue to follow.     Carly Moore  01/16/2023, 10:51 AM     Attending physician's note   I have taken history, reviewed the chart and examined the patient. I performed a substantive portion of this encounter, including complete performance of at least one of the key components, in conjunction with the APP. I agree with the Advanced Practitioner's note, impression and recommendations.   Pt was seen earlier this afternoon.  Plan to do EGD with wire-guided dilatation in a.m. after blood pressure is under good control. I have discussed risks and benefits with the patient again including small but definite risks of perforation which can require surgery.  Benefits were also discussed.  (Dr Myrtie Neither had also discussed the procedure, risks and benefits).  She is tentatively scheduled for EGD tomorrow at 1 PM   Edman Circle, MD [facility] [phone]

## 2023-01-16 NOTE — ED Provider Notes (Signed)
[place] EMERGENCY DEPARTMENT AT [facility] Provider Note   CSN: [id number] Arrival date & time: 01/16/23  0920     History  Chief complaint: Hypertension  Carly Moore is a 77 y.o. female.  HPI   Patient has a history of glaucoma hypertension diabetes dyslipidemia, chronic kidney disease.  Blood pressure is usually well-controlled.  Last year was 130/70 when she went to her primary care doctor's office to follow-up on her blood pressure.  Notes indicate patient had not been taking her blood pressure medications at that time.  Sinew her blood pressure medications.  Patient states she was scheduled to have an endoscopy today here at the hospital.  While they were preparing her for the procedure they noted her blood pressure was extremely elevated with systolics over 200s.  Patient was sent to the ED for further evaluation.  Patient denies any symptoms.  She states she is not having any headache.  No chest pain.  No shortness of breath.  No abdominal pain.  No numbness or weakness.  Patient states she has been compliant with her medications.  Patient has a history of pharyngeal esophageal dysphagia and esophageal stricture.  Patient does not feel like she is having any difficulty swallowing her blood pressure medications  Home Medications Prior to Admission medications   Medication Sig Start Date End Date Taking? Authorizing Provider  Calcium-Magnesium-Vitamin D (CALCIUM MAGNESIUM PO) Take by mouth daily.    [provider]  JARDIANCE 10 MG TABS tablet TAKE 1 TABLET BY MOUTH DAILY BEFORE BREAKFAST. 10/21/22   Georgina Quint, MD  lansoprazole (PREVACID) 3 mg/ml SUSP oral suspension Take 10 mLs (30 mg total) by mouth 2 (two) times daily. 01/16/23   Sherrilyn Rist, MD  latanoprost (XALATAN) 0.005 % ophthalmic solution  02/18/15   [provider]  losartan (COZAAR) 100 MG tablet TAKE 1 TABLET BY MOUTH EVERY DAY 10/21/22   Georgina Quint, MD   metFORMIN (GLUCOPHAGE) 1000 MG tablet TAKE 1 TABLET BY MOUTH 2 (TWO) TIMES DAILY WITH A MEAL. 10/21/22   Georgina Quint, MD  timolol (TIMOPTIC) 0.5 % ophthalmic solution Place 1 drop into both eyes at bedtime. 08/14/22   [provider]      Allergies    Sulfa antibiotics    Review of Systems   Review of Systems  Physical Exam Updated Vital Signs BP (!) 198/64   Pulse 63   Temp 98.3 F (36.8 C) (Oral)   Resp 20   Ht 1.524 m (5')   Wt 63 kg   SpO2 97%   BMI 27.15 kg/m  Physical Exam Vitals and nursing note reviewed.  Constitutional:      General: She is not in acute distress.    Appearance: She is well-developed.  HENT:     Head: Normocephalic and atraumatic.     Right Ear: External ear normal.     Left Ear: External ear normal.  Eyes:     General: No scleral icterus.       Right eye: No discharge.        Left eye: No discharge.     Conjunctiva/sclera: Conjunctivae normal.  Neck:     Trachea: No tracheal deviation.  Cardiovascular:     Rate and Rhythm: Normal rate and regular rhythm.  Pulmonary:     Effort: Pulmonary effort is normal. No respiratory distress.     Breath sounds: Normal breath sounds. No stridor. No wheezing or rales.  Abdominal:  General: Bowel sounds are normal. There is no distension.     Palpations: Abdomen is soft.     Tenderness: There is no abdominal tenderness. There is no guarding or rebound.  Musculoskeletal:        General: No tenderness or deformity.     Cervical back: Neck supple.  Skin:    General: Skin is warm and dry.     Findings: No rash.  Neurological:     General: No focal deficit present.     Mental Status: She is alert.     Cranial Nerves: No cranial nerve deficit, dysarthria or facial asymmetry.     Sensory: No sensory deficit.     Motor: No abnormal muscle tone or seizure activity.     Coordination: Coordination normal.  Psychiatric:        Mood and Affect: Mood normal.     ED Results /  Procedures / Treatments   Labs (all labs ordered are listed, but only abnormal results are displayed) Labs Reviewed  CBC - Abnormal; Notable for the following components:      Result Value   Hemoglobin 16.2 (*)    HCT 47.7 (*)    All other components within normal limits  BASIC METABOLIC PANEL - Abnormal; Notable for the following components:   Glucose, Bld 183 (*)    Creatinine, Ser 1.08 (*)    GFR, Estimated 53 (*)    All other components within normal limits    EKG EKG Interpretation Date/Time:  Thursday January 16 2023 09:34:08 EDT Ventricular Rate:  71 PR Interval:  139 QRS Duration:  75 QT Interval:  416 QTC Calculation: 453 R Axis:   61  Text Interpretation: Sinus rhythm Confirmed by Linwood Dibbles [phone]) on 01/16/2023 9:37:34 AM  Radiology No results found.  Procedures Procedures    Medications Ordered in ED Medications  labetalol (NORMODYNE) injection 10 mg (10 mg Intravenous Given 01/16/23 1013)    ED Course/ Medical Decision Making/ A&P Clinical Course as of 01/16/23 1135  Thu Jan 16, 2023  1047 CBC normal.  Metabolic panel shows slightly increased creatinine and elevated blood sugar doubt clinically significant [JK]  1048 Reviewed case with Dr. Myrtie Neither.  He requests patient be admitted medically so her blood pressure can be stabilized and she could get her procedure done tomorrow.  They are unable to add her onto the schedule for several weeks to have an outpatient procedure.   [JK]  1115 BP 198/64 [JK]  1133 Case discussed with Dr. Erenest Blank.  Will admit to the hospital for further treatment [JK]    Clinical Course User Index [JK] Linwood Dibbles, MD                                 Medical Decision Making Problems Addressed: Dysphagia, unspecified type: acute illness or injury that poses a threat to life or bodily functions Uncontrolled hypertension: acute illness or injury that poses a threat to life or bodily functions  Amount and/or Complexity of Data  Reviewed Labs: ordered. Decision-making details documented in ED Course.  Risk Prescription drug management. Decision regarding hospitalization.   Patient presented to the ED for uncontrolled hypertension.  Patient was actually scheduled for an endoscopy because of her persistent and worsening dysphagia.  Patient noted to be severely hypertensive and the procedure was canceled.  Patient denies any complaints of chest pain.  No numbness or weakness.  No headache.  No  signs of end organ ischemia at this time.  Patient however does have severely uncontrolled hypertension.  I have ordered a dose of labetalol.  We will continue to monitor closely.  Plan will be for admission to the hospital for further management of her blood pressure.  Anticipate endoscopy tomorrow for her worsening dysphagia assuming blood pressure stabilization.  Patient was seen by [facility] team in the ED        Final Clinical Impression(s) / ED Diagnoses Final diagnoses:  Uncontrolled hypertension  Dysphagia, unspecified type    Rx / DC Orders ED Discharge Orders     None         Linwood Dibbles, MD 01/16/23 1135

## 2023-01-16 NOTE — Plan of Care (Signed)
  Problem: Education: Goal: Knowledge of General Education information will improve Description: Including pain rating scale, medication(s)/side effects and non-pharmacologic comfort measures Outcome: Progressing   Problem: Clinical Measurements: Goal: Ability to maintain clinical measurements within normal limits will improve Outcome: Progressing   Problem: Activity: Goal: Risk for activity intolerance will decrease Outcome: Progressing   Problem: Pain Management: Goal: General experience of comfort will improve Outcome: Progressing

## 2023-01-17 ENCOUNTER — Encounter (HOSPITAL_COMMUNITY)
Admission: EM | Disposition: A | Discharge status: Home / Self Care | Payer: Self-pay | Source: Home / Self Care | Attending: Family Medicine

## 2023-01-17 ENCOUNTER — Observation Stay (HOSPITAL_COMMUNITY): Payer: Medicare HMO

## 2023-01-17 ENCOUNTER — Encounter (HOSPITAL_COMMUNITY): Payer: Self-pay | Admitting: Internal Medicine

## 2023-01-17 ENCOUNTER — Telehealth: Payer: Self-pay

## 2023-01-17 ENCOUNTER — Observation Stay (HOSPITAL_COMMUNITY): Payer: Medicare HMO | Admitting: Certified Registered"

## 2023-01-17 DIAGNOSIS — K2100 Gastro-esophageal reflux disease with esophagitis, without bleeding: Secondary | ICD-10-CM | POA: Diagnosis not present

## 2023-01-17 DIAGNOSIS — I160 Hypertensive urgency: Secondary | ICD-10-CM | POA: Diagnosis not present

## 2023-01-17 DIAGNOSIS — E7800 Pure hypercholesterolemia, unspecified: Secondary | ICD-10-CM | POA: Diagnosis not present

## 2023-01-17 DIAGNOSIS — Z833 Family history of diabetes mellitus: Secondary | ICD-10-CM | POA: Diagnosis not present

## 2023-01-17 DIAGNOSIS — I161 Hypertensive emergency: Secondary | ICD-10-CM | POA: Diagnosis not present

## 2023-01-17 DIAGNOSIS — Z7984 Long term (current) use of oral hypoglycemic drugs: Secondary | ICD-10-CM | POA: Diagnosis not present

## 2023-01-17 DIAGNOSIS — N189 Chronic kidney disease, unspecified: Secondary | ICD-10-CM | POA: Diagnosis not present

## 2023-01-17 DIAGNOSIS — E1165 Type 2 diabetes mellitus with hyperglycemia: Secondary | ICD-10-CM | POA: Diagnosis not present

## 2023-01-17 DIAGNOSIS — K2270 Barrett's esophagus without dysplasia: Secondary | ICD-10-CM | POA: Diagnosis not present

## 2023-01-17 DIAGNOSIS — E119 Type 2 diabetes mellitus without complications: Secondary | ICD-10-CM | POA: Diagnosis not present

## 2023-01-17 DIAGNOSIS — Z91148 Patient's other noncompliance with medication regimen for other reason: Secondary | ICD-10-CM | POA: Diagnosis not present

## 2023-01-17 DIAGNOSIS — K449 Diaphragmatic hernia without obstruction or gangrene: Secondary | ICD-10-CM | POA: Diagnosis not present

## 2023-01-17 DIAGNOSIS — Z79899 Other long term (current) drug therapy: Secondary | ICD-10-CM | POA: Diagnosis not present

## 2023-01-17 DIAGNOSIS — N1831 Chronic kidney disease, stage 3a: Secondary | ICD-10-CM | POA: Diagnosis not present

## 2023-01-17 DIAGNOSIS — Z882 Allergy status to sulfonamides status: Secondary | ICD-10-CM | POA: Diagnosis not present

## 2023-01-17 DIAGNOSIS — I10 Essential (primary) hypertension: Secondary | ICD-10-CM | POA: Diagnosis not present

## 2023-01-17 DIAGNOSIS — Z8249 Family history of ischemic heart disease and other diseases of the circulatory system: Secondary | ICD-10-CM | POA: Diagnosis not present

## 2023-01-17 DIAGNOSIS — R1310 Dysphagia, unspecified: Secondary | ICD-10-CM | POA: Diagnosis not present

## 2023-01-17 DIAGNOSIS — E1122 Type 2 diabetes mellitus with diabetic chronic kidney disease: Secondary | ICD-10-CM | POA: Diagnosis not present

## 2023-01-17 DIAGNOSIS — I129 Hypertensive chronic kidney disease with stage 1 through stage 4 chronic kidney disease, or unspecified chronic kidney disease: Secondary | ICD-10-CM

## 2023-01-17 DIAGNOSIS — Z811 Family history of alcohol abuse and dependence: Secondary | ICD-10-CM | POA: Diagnosis not present

## 2023-01-17 DIAGNOSIS — E1169 Type 2 diabetes mellitus with other specified complication: Secondary | ICD-10-CM | POA: Diagnosis not present

## 2023-01-17 DIAGNOSIS — K222 Esophageal obstruction: Secondary | ICD-10-CM

## 2023-01-17 HISTORY — PX: ESOPHAGOGASTRODUODENOSCOPY (EGD) WITH PROPOFOL: SHX5813

## 2023-01-17 HISTORY — PX: BIOPSY: SHX5522

## 2023-01-17 HISTORY — PX: BALLOON DILATION: SHX5330

## 2023-01-17 LAB — GLUCOSE, CAPILLARY
Glucose-Capillary: 157 mg/dL — ABNORMAL HIGH (ref 70–99)
Glucose-Capillary: 141 mg/dL — ABNORMAL HIGH (ref 70–99)
Glucose-Capillary: 129 mg/dL — ABNORMAL HIGH (ref 70–99)
Glucose-Capillary: 196 mg/dL — ABNORMAL HIGH (ref 70–99)

## 2023-01-17 SURGERY — ESOPHAGOGASTRODUODENOSCOPY (EGD) WITH PROPOFOL
Anesthesia: Monitor Anesthesia Care

## 2023-01-17 MED ORDER — PROPOFOL 500 MG/50ML IV EMUL
INTRAVENOUS | Status: DC | PRN
  Administered 2023-01-17: 60 ug/kg/min via INTRAVENOUS

## 2023-01-17 MED ORDER — EPHEDRINE SULFATE (PRESSORS) 50 MG/ML IJ SOLN
INTRAMUSCULAR | Status: DC | PRN
  Administered 2023-01-17: 5 mg via INTRAVENOUS

## 2023-01-17 MED ORDER — NON FORMULARY
20.0000 mg | Freq: Two times a day (BID) | Status: DC
Start: 2023-01-17 — End: 2023-01-17

## 2023-01-17 MED ORDER — SODIUM CHLORIDE 0.9 % IV SOLN: INTRAVENOUS | Status: DC | PRN

## 2023-01-17 MED ORDER — OMEPRAZOLE 20 MG PO CPDR
20.0000 mg | DELAYED_RELEASE_CAPSULE | Freq: Two times a day (BID) | ORAL | Status: DC
  Administered 2023-01-17 – 2023-01-18 (×2): 20 mg via ORAL
  Filled 2023-01-17 (×2): qty 1

## 2023-01-17 MED ORDER — PHENYLEPHRINE HCL (PRESSORS) 10 MG/ML IV SOLN
INTRAVENOUS | Status: DC | PRN
  Administered 2023-01-17: 100 ug via INTRAVENOUS

## 2023-01-17 MED ORDER — LIDOCAINE 2% (20 MG/ML) 5 ML SYRINGE
INTRAMUSCULAR | Status: DC | PRN
  Administered 2023-01-17: 40 mg via INTRAVENOUS

## 2023-01-17 MED ORDER — PROPOFOL 10 MG/ML IV BOLUS
INTRAVENOUS | Status: DC | PRN
  Administered 2023-01-17: 10 mg via INTRAVENOUS
  Administered 2023-01-17: 20 mg via INTRAVENOUS
  Administered 2023-01-17: 10 mg via INTRAVENOUS
  Administered 2023-01-17: 20 mg via INTRAVENOUS
  Administered 2023-01-17: 50 mg via INTRAVENOUS

## 2023-01-17 MED ORDER — HYDRALAZINE HCL 20 MG/ML IJ SOLN: 10.0000 mg | INTRAMUSCULAR | Status: DC | PRN

## 2023-01-17 MED ORDER — AMLODIPINE BESYLATE 5 MG PO TABS
5.0000 mg | ORAL_TABLET | Freq: Every day | ORAL | Status: DC
  Administered 2023-01-17: 5 mg via ORAL
  Filled 2023-01-17: qty 1

## 2023-01-17 SURGICAL SUPPLY — 15 items

## 2023-01-17 NOTE — Interval H&P Note (Signed)
History and Physical Interval Note:  01/17/2023 1:27 PM  Carly Moore  has presented today for surgery, with the diagnosis of esophageal stricture, esophageal dysphagia.  The various methods of treatment have been discussed with the patient and family. After consideration of risks, benefits and other options for treatment, the patient has consented to  Procedure(s): ESOPHAGOGASTRODUODENOSCOPY (EGD) WITH PROPOFOL (N/A) SAVORY DILATION (N/A) as a surgical intervention.  The patient's history has been reviewed, patient examined, no change in status, stable for surgery.  I have reviewed the patient's chart and labs.  Questions were answered to the patient's satisfaction.     Lynann Bologna

## 2023-01-17 NOTE — Op Note (Signed)
[facility] Patient Name: Carly Moore Procedure Date: 01/17/2023 MRN: [id number] Attending MD: Lynann Bologna , MD, [id number] Date of Birth: 1945-10-24 CSN: [id number] Age: 77 Admit Type: Inpatient Procedure:                Upper GI endoscopy Indications:              Dysphagia in a patient with known upper esophageal                            stricture. EGD could not be performed yesterday d/t                            uncontrolled hypertension. Blood pressure is better                            controlled today. Providers:                Lynann Bologna, MD, Margaree Mackintosh, RN Referring MD:             Dr Amada Jupiter Medicines:                Monitored Anesthesia Care Complications:            No immediate complications. Estimated Blood Loss:     Estimated blood loss was minimal. Procedure:                Pre-Anesthesia Assessment:                           - Prior to the procedure, a History and Physical                            was performed, and patient medications and                            allergies were reviewed. The patient's tolerance of                            previous anesthesia was also reviewed. The risks                            and benefits of the procedure and the sedation                            options and risks were discussed with the patient.                            All questions were answered, and informed consent                            was obtained. Prior Anticoagulants: The patient has                            taken no anticoagulant or antiplatelet agents. ASA  Grade Assessment: II - A patient with mild systemic                            disease. After reviewing the risks and benefits,                            the patient was deemed in satisfactory condition to                            undergo the procedure.                           After obtaining informed consent, the endoscope  was                            passed under direct vision. Throughout the                            procedure, the patient's blood pressure, pulse, and                            oxygen saturations were monitored continuously. The                            [id number]-H190 ([id number]) Olympus endoscope was introduced                            through the mouth, and advanced to the second part                            of duodenum. The upper GI endoscopy was                            accomplished without difficulty. The patient                            tolerated the procedure well. Scope In: Scope Out: Findings:      One benign-appearing, intrinsic severe (stenosis; an endoscope cannot       pass) Px esophageal stenosis was found 22 cm from the incisors with       surrounding esophagitis. This stenosis measured 7 mm (inner       diameter-determined by the balloon) x less than one cm (in length). The       stenosis was traversed after dilation. A WIRE-GUIDED TTS dilator was       passed through the scope under fluoroscopy guidance. Dilation with a       6-7-8 mm x 5.5 cm CRE balloon and an 10-31-08 mm x 5.5 cm CRE balloon       dilator was performed to 10 mm under fluoroscopic guidance until the       waist was completely obliterated (see fluoroscopic images). The dilation       site was examined and showed moderate mucosal disruption and moderate       improvement in luminal narrowing. Thereafter the scope could easily       passed  beyond. Biopsies were taken with a cold forceps for histology.      The esophagus and gastroesophageal junction were examined with white       light and narrow band imaging (NBI). There were esophageal mucosal       changes consistent with long-segment Barrett's esophagus.       Circumferential salmon-colored mucosa was present from 22 to 28 cm (from       stricture to GE junction). The maximum longitudinal extent of these       esophageal mucosal changes was 6 cm  in length. Mucosa was biopsied with       a cold forceps for histology in 4 quadrants at intervals of 1.5 cm. A       total of 4 specimen bottles were sent to pathology (including biopsies       from the stricture).      A 3 cm hiatal hernia was present.      The exam was otherwise without abnormality. Normal stomach and duodenum. Impression:               - Proximal esophageal Barrett's stricture with                            esophagitis. Dilated. Biopsied.                           - Esophageal mucosal changes consistent with                            long-segment Barrett's esophagus. Biopsied.                           - 3 cm hiatal hernia.                           - The examination was otherwise normal. Moderate Sedation:      Not Applicable - Patient had care per Anesthesia. Recommendation:           - Patient has a contact number available for                            emergencies. The signs and symptoms of potential                            delayed complications were discussed with the                            patient. Return to normal activities tomorrow.                            Written discharge instructions were provided to the                            patient.                           - Full liquid diet x 4 hours, then soft diet today.                           -  Use Prilosec (omeprazole) 20 mg PO BID for 8                            weeks, then QD indefinitely. Please open the                            capsule and put it in applesauce. Do not chew the                            spherules.                           - Pt wants to hold off Carafate slurry d/t cost.                           - Return to GI clinic in 4 weeks- APP clinic or Dr                            Myrtie Neither.                           - Follow biopsies.                           - Watch for any complications. If BP is under good                            control, can be discharged home later  today or                            tomorrow morning.                           - The findings and recommendations were discussed                            with the patient.                           - The findings and recommendations were discussed                            with the patient's family. Procedure Code(s):        --- Professional ---                           [phone], Esophagogastroduodenoscopy, flexible,                            transoral; with transendoscopic balloon dilation of                            esophagus (less than 30 mm diameter)  16109, 59, Esophagogastroduodenoscopy, flexible,                            transoral; with biopsy, single or multiple                           74360, Intraluminal dilation of strictures and/or                            obstructions (eg, esophagus), radiological                            supervision and interpretation Diagnosis Code(s):        --- Professional ---                           K22.2, Esophageal obstruction                           K22.89, Other specified disease of esophagus                           K44.9, Diaphragmatic hernia without obstruction or                            gangrene                           R13.10, Dysphagia, unspecified CPT copyright 2022 [employer]. All rights reserved. The codes documented in this report are preliminary and upon coder review may  be revised to meet current compliance requirements. Lynann Bologna, MD 01/17/2023 2:29:22 PM This report has been signed electronically. Number of Addenda: 0

## 2023-01-17 NOTE — Anesthesia Preprocedure Evaluation (Addendum)
Anesthesia Evaluation  Patient identified by MRN, date of birth, ID band Patient awake    Reviewed: Allergy & Precautions, NPO status , Patient's Chart, lab work & pertinent test results  History of Anesthesia Complications Negative for: history of anesthetic complications  Airway Mallampati: II  TM Distance: >3 FB Neck ROM: Full    Dental  (+) Caps, Dental Advisory Given   Pulmonary neg pulmonary ROS   breath sounds clear to auscultation       Cardiovascular hypertension (not on BP meds at home), (-) angina  Rhythm:Regular Rate:Normal     Neuro/Psych glaucoma    GI/Hepatic Neg liver ROS,GERD  Medicated and Controlled,,  Endo/Other  diabetes (glu 141), Oral Hypoglycemic Agents  jardiance  Renal/GU Renal InsufficiencyRenal disease     Musculoskeletal negative musculoskeletal ROS (+)    Abdominal   Peds  Hematology negative hematology ROS (+)   Anesthesia Other Findings   Reproductive/Obstetrics                             Anesthesia Physical Anesthesia Plan  ASA: 3  Anesthesia Plan: MAC   Post-op Pain Management: Minimal or no pain anticipated   Induction:   PONV Risk Score and Plan: 2 and Treatment may vary due to age or medical condition  Airway Management Planned: Natural Airway and Nasal Cannula  Additional Equipment: None  Intra-op Plan:   Post-operative Plan:   Informed Consent: I have reviewed the patients History and Physical, chart, labs and discussed the procedure including the risks, benefits and alternatives for the proposed anesthesia with the patient or authorized representative who has indicated his/her understanding and acceptance.     Dental advisory given  Plan Discussed with: CRNA and Surgeon  Anesthesia Plan Comments:        Anesthesia Quick Evaluation

## 2023-01-17 NOTE — Progress Notes (Signed)
PROGRESS NOTE    Carly Moore  WVP:[id number] DOB: April 05, 1945 DOA: 01/16/2023 PCP: Georgina Quint, MD   Brief Narrative:  HPI: Carly Moore is a 77 y.o. female with medical history significant for hypertension, type 2 diabetes and recently diagnosed proximal esophageal stricture who presented for upper endoscopy and dilation today and is being admitted to the hospital with hypertensive urgency.  Patient states that her blood pressure usually pretty well-controlled, and that although she has been having trouble with oral intake and regurgitation of food for the last few weeks, she has been able to take her medications.  Recent outpatient notes state that the patient has been intermittently compliant with medications, however patient states that she has been taking her medications and in particular her losartan as prescribed.  She denies any recent illness, no vomiting, fevers, chills, chest pain, headache, or confusion.  She presented for outpatient endoscopy today as planned, but procedure was canceled and the patient was sent to the emergency department due to blood pressure as high as 248/86.  Patient was given a dose of IV labetalol in the emergency department, blood pressure is gradually improving, lab work is unrevealing and hospitalist admission was requested for blood pressure control.   Assessment & Plan:   Principal Problem:   Hypertensive urgency  Hypertensive urgency-without chest pain, headache, AMS or other concerning symptoms.  Unclear etiology of her hypertension, as the patient states she has been compliant with her home medication.  During her most recent PCP visit this past summer, blood pressure was 138/78.  She denies palpitations, tremor, etc. Home medications resumed but blood pressure still elevated.  Now we will schedule amlodipine 5 mg and add as needed hydralazine. If hypertension persists, would consider workup to rule out primary hyperaldosteronism,  pheochromocytoma, etc.   Type 2 diabetes-noted to be not well-controlled, last hemoglobin A1c 8.0 July 2024.  PTA on metformin and Jardiance.  Currently on SSI.   Esophageal stricture-likely related to acid reflux GI on board and endoscopy scheduled today.  DVT prophylaxis: SCDs Start: 01/16/23 1135   Code Status: Full Code  Family Communication:  None present at bedside.  Plan of care discussed with patient in length and he/she verbalized understanding and agreed with it.  Status is: Observation The patient will require care spanning > 2 midnights and should be moved to inpatient because: EGD today and will need further observation for hypertensive urgency.   Estimated body mass index is 27.15 kg/m as calculated from the following:   Height as of this encounter: 5' (1.524 m).   Weight as of this encounter: 63 kg.    Nutritional Assessment: Body mass index is 27.15 kg/m.Marland Kitchen Seen by dietician.  I agree with the assessment and plan as outlined below: Nutrition Status:        . Skin Assessment: I have examined the patient's skin and I agree with the wound assessment as performed by the wound care RN as outlined below:    Consultants:  GI  Procedures:  As above  Antimicrobials:  Anti-infectives (From admission, onward)    None         Subjective: Patient seen and examined, she has no complaints.  Objective: Vitals:   01/16/23 2340 01/17/23 0500 01/17/23 0535 01/17/23 0845  BP: (!) 176/66 (!) 171/69 (!) 140/49 (!) 193/71  Pulse: 64 61 61   Resp:      Temp: 98.5 F (36.9 C) 98.3 F (36.8 C)    TempSrc: Oral Oral  SpO2: 99% 99%    Weight:      Height:        Intake/Output Summary (Last 24 hours) at 01/17/2023 0910 Last data filed at 01/16/2023 1802 Gross per 24 hour  Intake 350 ml  Output --  Net 350 ml   Filed Weights   01/16/23 0925  Weight: 63 kg    Examination:  General exam: Appears calm and comfortable  Respiratory system: Clear to  auscultation. Respiratory effort normal. Cardiovascular system: S1 & S2 heard, RRR. No JVD, murmurs, rubs, gallops or clicks. No pedal edema. Gastrointestinal system: Abdomen is nondistended, soft and nontender. No organomegaly or masses felt. Normal bowel sounds heard. Central nervous system: Alert and oriented. No focal neurological deficits. Extremities: Symmetric 5 x 5 power. Skin: No rashes, lesions or ulcers Psychiatry: Judgement and insight appear normal. Mood & affect appropriate.    Data Reviewed: I have personally reviewed following labs and imaging studies  CBC: Recent Labs  Lab 01/16/23 1012  WBC 6.1  HGB 16.2*  HCT 47.7*  MCV 93.9  PLT 238   Basic Metabolic Panel: Recent Labs  Lab 01/16/23 1012  NA 136  K 4.2  CL 101  CO2 25  GLUCOSE 183*  BUN 15  CREATININE 1.08*  CALCIUM 9.2   GFR: Estimated Creatinine Clearance: 35.6 mL/min (A) (by C-G formula based on SCr of 1.08 mg/dL (H)). Liver Function Tests: No results for input(s): "AST", "ALT", "ALKPHOS", "BILITOT", "PROT", "ALBUMIN" in the last 168 hours. No results for input(s): "LIPASE", "AMYLASE" in the last 168 hours. No results for input(s): "AMMONIA" in the last 168 hours. Coagulation Profile: No results for input(s): "INR", "PROTIME" in the last 168 hours. Cardiac Enzymes: No results for input(s): "CKTOTAL", "CKMB", "CKMBINDEX", "TROPONINI" in the last 168 hours. BNP (last 3 results) No results for input(s): "PROBNP" in the last 8760 hours. HbA1C: No results for input(s): "HGBA1C" in the last 72 hours. CBG: Recent Labs  Lab 01/16/23 1221 01/16/23 1604 01/16/23 2049 01/17/23 0727  GLUCAP 191* 256* 169* 157*   Lipid Profile: No results for input(s): "CHOL", "HDL", "LDLCALC", "TRIG", "CHOLHDL", "LDLDIRECT" in the last 72 hours. Thyroid Function Tests: No results for input(s): "TSH", "T4TOTAL", "FREET4", "T3FREE", "THYROIDAB" in the last 72 hours. Anemia Panel: No results for input(s):  "VITAMINB12", "FOLATE", "FERRITIN", "TIBC", "IRON", "RETICCTPCT" in the last 72 hours. Sepsis Labs: No results for input(s): "PROCALCITON", "LATICACIDVEN" in the last 168 hours.  No results found for this or any previous visit (from the past 240 hour(s)).   Radiology Studies: No results found.  Scheduled Meds:  amLODipine  5 mg Oral Daily   insulin aspart  0-15 Units Subcutaneous TID WC   insulin aspart  0-5 Units Subcutaneous QHS   isosorbide dinitrate  10 mg Oral TID   latanoprost  1 drop Both Eyes QHS   losartan  100 mg Oral Daily   pantoprazole (PROTONIX) IV  40 mg Intravenous Q12H   timolol  1 drop Both Eyes QHS   Continuous Infusions:   LOS: 0 days   Hughie Closs, MD [facility]  01/17/2023, 9:10 AM   *Please note that this is a verbal dictation therefore any spelling or grammatical errors are due to the "Dragon Medical One" system interpretation.  Please page via Amion and do not message via secure chat for urgent patient care matters. Secure chat can be used for non urgent patient care matters.  How to contact the [facility] Attending or Consulting provider 7A - 7P or  covering provider during after hours 7P -7A, for this patient?  Check the care team in [facility] and look for a) attending/consulting TR[facility] provider listed and b) the [facility] team listed. Page or secure chat 7A-7P. Log into [email or web address] and use [place]'s universal password to access. If you do not have the password, please contact the hospital operator. Locate the [facility] provider you are looking for under [facility] and page to a number that you can be directly reached. If you still have difficulty reaching the provider, please page the [facility] (Director on Call) for the Hospitalists listed on amion for assistance.

## 2023-01-17 NOTE — Transfer of Care (Signed)
Immediate Anesthesia Transfer of Care Note  Patient: Carly Moore  Procedure(s) Performed: ESOPHAGOGASTRODUODENOSCOPY (EGD) WITH PROPOFOL BALLOON DILATION BIOPSY  Patient Location: PACU  Anesthesia Type:MAC  Level of Consciousness: awake, alert , oriented, and patient cooperative  Airway & Oxygen Therapy: Patient Spontanous Breathing and Patient connected to nasal cannula oxygen  Post-op Assessment: Report given to RN and Post -op Vital signs reviewed and stable  Post vital signs: Reviewed and stable  Last Vitals:  Vitals Value Taken Time  BP    Temp    Pulse 68 01/17/23 1416  Resp 20 01/17/23 1416  SpO2 100 % 01/17/23 1416  Vitals shown include unfiled device data.  Last Pain:  Vitals:   01/17/23 1210  TempSrc: Temporal  PainSc: 0-No pain         Complications: No notable events documented.

## 2023-01-17 NOTE — Anesthesia Postprocedure Evaluation (Signed)
Anesthesia Post Note  Patient: Carly Moore  Procedure(s) Performed: ESOPHAGOGASTRODUODENOSCOPY (EGD) WITH PROPOFOL BALLOON DILATION BIOPSY     Patient location during evaluation: Phase II Anesthesia Type: MAC Level of consciousness: awake and alert, patient cooperative and oriented Pain management: pain level controlled Vital Signs Assessment: vitals unstable and post-procedure vital signs reviewed and stable Respiratory status: spontaneous breathing, nonlabored ventilation and respiratory function stable Cardiovascular status: stable and blood pressure returned to baseline Postop Assessment: no apparent nausea or vomiting Anesthetic complications: no   No notable events documented.  Last Vitals:  Vitals:   01/17/23 1430 01/17/23 1440  BP: (!) 147/55 (!) 172/70  Pulse: 66 64  Resp: 16 20  Temp:    SpO2: 96% 98%    Last Pain:  Vitals:   01/17/23 1440  TempSrc:   PainSc: 0-No pain                 Bridgette Wolden,E. Haron Beilke

## 2023-01-18 DIAGNOSIS — K222 Esophageal obstruction: Secondary | ICD-10-CM

## 2023-01-18 DIAGNOSIS — I160 Hypertensive urgency: Secondary | ICD-10-CM | POA: Diagnosis not present

## 2023-01-18 DIAGNOSIS — K2270 Barrett's esophagus without dysplasia: Secondary | ICD-10-CM | POA: Insufficient documentation

## 2023-01-18 LAB — BASIC METABOLIC PANEL
Anion gap: 10 (ref 5–15)
BUN: 13 mg/dL (ref 8–23)
CO2: 23 mmol/L (ref 22–32)
Calcium: 9.2 mg/dL (ref 8.9–10.3)
Chloride: 104 mmol/L (ref 98–111)
Creatinine, Ser: 1.12 mg/dL — ABNORMAL HIGH (ref 0.44–1.00)
GFR, Estimated: 50 mL/min — ABNORMAL LOW (ref >60)
Glucose, Bld: 170 mg/dL — ABNORMAL HIGH (ref 70–99)
Potassium: 3.5 mmol/L (ref 3.5–5.1)
Sodium: 137 mmol/L (ref 135–145)

## 2023-01-18 LAB — GLUCOSE, CAPILLARY: Glucose-Capillary: 126 mg/dL — ABNORMAL HIGH (ref 70–99)

## 2023-01-18 MED ORDER — AMLODIPINE BESYLATE 10 MG PO TABS: 10.0000 mg | ORAL_TABLET | Freq: Every day | ORAL | 0 refills | Status: DC

## 2023-01-18 MED ORDER — AMLODIPINE BESYLATE 5 MG PO TABS
10.0000 mg | ORAL_TABLET | Freq: Every day | ORAL | Status: DC
  Administered 2023-01-18: 10 mg via ORAL
  Filled 2023-01-18: qty 2

## 2023-01-18 NOTE — Discharge Summary (Signed)
Physician Discharge Summary  Carly Moore [id number]:[id number] DOB: 1945/10/22 DOA: 01/16/2023  PCP: Georgina Quint, MD  Admit date: 01/16/2023 Discharge date: 01/18/2023 30 Day Unplanned Readmission Risk Score    Flowsheet Row ED to Hosp-Admission (Current) from 01/16/2023 in [place] 4TH FLOOR PROGRESSIVE CARE AND UROLOGY  30 Day Unplanned Readmission Risk Score (%) 9.08 Filed at 01/18/2023 0800       This score is the patient's risk of an unplanned readmission within 30 days of being discharged (0 -100%). The score is based on dignosis, age, lab data, medications, orders, and past utilization.   Low:  0-14.9   Medium: 15-21.9   High: 22-29.9   Extreme: 30 and above          Admitted From: Home Disposition: Home  Recommendations for Outpatient Follow-up:  Follow up with PCP in 1-2 weeks Please obtain BMP/CBC in one week Follow-up with your primary GI/Dr. Myrtie Neither as scheduled. Please follow up with your PCP on the following pending results: Unresulted Labs (From admission, onward)    None         Home Health: None Equipment/Devices: None  Discharge Condition: Stable CODE STATUS: Full code Diet recommendation: Cardiac  Subjective: Seen and examined.  No complaints.  She is eager to go home.  Brief/Interim Summary: Carly Moore is a 77 y.o. female with medical history significant for hypertension, type 2 diabetes and recently diagnosed proximal esophageal stricture who presented for upper endoscopy and dilation but was found to have hypertensive urgency so procedure was canceled and she was admitted under hospital service for management of that.  She had no other complaints such as chest pain or palpitation.  Per patient, she is compliant with her medications.  Her home medications were resumed with as needed Ativan.  Blood pressure remained elevated, amlodipine 5 mg added to her regime, which was increased to 10 mg due to persistently elevated blood pressure and  her blood pressure is now within normal range.  She is very eager to go home.  Addition of amlodipine has been informed to her and she is in agreement.   Type 2 diabetes-noted to be not well-controlled, last hemoglobin A1c 8.0 July 2024.  PTA on metformin and Jardiance.     Esophageal stricture-likely related to acid reflux.  Underwent EGD 01/17/2023, esophagus dilated, has long segment of Barrett's esophagus.  Resuming PTA PPI.  Follow-up with primary GI.  Discharge plan was discussed with patient and/or family member and they verbalized understanding and agreed with it.  Discharge Diagnoses:  Principal Problem:   Hypertensive urgency Active Problems:   Hypertension associated with diabetes (HCC)   Stage 3a chronic kidney disease (HCC)   Esophageal stricture   Barrett esophagus    Discharge Instructions   Allergies as of 01/18/2023       Reactions   Sulfa Antibiotics Rash        Medication List     TAKE these medications    amLODipine 10 MG tablet Commonly known as: NORVASC Take 1 tablet (10 mg total) by mouth daily.   BERBERINE HCI PO Take 1 tablet by mouth daily.   CALCIUM MAGNESIUM PO Take 1 tablet by mouth daily.   Jardiance 10 MG Tabs tablet Generic drug: empagliflozin TAKE 1 TABLET BY MOUTH DAILY BEFORE BREAKFAST.   lansoprazole 3 mg/ml Susp oral suspension Commonly known as: PREVACID Take 10 mLs (30 mg total) by mouth 2 (two) times daily.   latanoprost 0.005 % ophthalmic solution Commonly known as: Harrel Lemon  Place 1 drop into both eyes at bedtime.   losartan 100 MG tablet Commonly known as: COZAAR TAKE 1 TABLET BY MOUTH EVERY DAY   metFORMIN 1000 MG tablet Commonly known as: GLUCOPHAGE TAKE 1 TABLET BY MOUTH 2 (TWO) TIMES DAILY WITH A MEAL.   OVER THE COUNTER MEDICATION Take 1 tablet by mouth daily. Super beet   QC TUMERIC COMPLEX PO Take 1 tablet by mouth daily.   timolol 0.5 % ophthalmic solution Commonly known as: TIMOPTIC Place 1 drop  into both eyes daily.        Follow-up Information     Georgina Quint, MD Follow up in 1 week(s).   Specialty: Internal Medicine Contact information: [address] [place] [address] [phone]                Allergies  Allergen Reactions   Sulfa Antibiotics Rash    Consultations: GI   Procedures/Studies: DG C-Arm 1-60 Min-No Report  Result Date: 01/17/2023 Fluoroscopy was utilized by the requesting physician.  No radiographic interpretation.     Discharge Exam: Vitals:   01/18/23 0846 01/18/23 0945  BP: (!) 173/82 (!) 157/65  Pulse: 69   Resp: 14 20  Temp: 98.3 F (36.8 C) 98.2 F (36.8 C)  SpO2: 99% 98%   Vitals:   01/17/23 2129 01/18/23 0430 01/18/23 0846 01/18/23 0945  BP: (!) 153/86 (!) 169/62 (!) 173/82 (!) 157/65  Pulse: 76 68 69   Resp: 16 14 14 20   Temp: 97.6 F (36.4 C) 98.6 F (37 C) 98.3 F (36.8 C) 98.2 F (36.8 C)  TempSrc: Oral Oral Oral Oral  SpO2: 98% 99% 99% 98%  Weight:      Height:        General: Pt is alert, awake, not in acute distress Cardiovascular: RRR, S1/S2 +, no rubs, no gallops Respiratory: CTA bilaterally, no wheezing, no rhonchi Abdominal: Soft, NT, ND, bowel sounds + Extremities: no edema, no cyanosis    The results of significant diagnostics from this hospitalization (including imaging, microbiology, ancillary and laboratory) are listed below for reference.     Microbiology: No results found for this or any previous visit (from the past 240 hour(s)).   Labs: BNP (last 3 results) No results for input(s): "BNP" in the last 8760 hours. Basic Metabolic Panel: Recent Labs  Lab 01/16/23 1012 01/18/23 0921  NA 136 137  K 4.2 3.5  CL 101 104  CO2 25 23  GLUCOSE 183* 170*  BUN 15 13  CREATININE 1.08* 1.12*  CALCIUM 9.2 9.2   Liver Function Tests: No results for input(s): "AST", "ALT", "ALKPHOS", "BILITOT", "PROT", "ALBUMIN" in the last 168 hours. No results for input(s): "LIPASE",  "AMYLASE" in the last 168 hours. No results for input(s): "AMMONIA" in the last 168 hours. CBC: Recent Labs  Lab 01/16/23 1012  WBC 6.1  HGB 16.2*  HCT 47.7*  MCV 93.9  PLT 238   Cardiac Enzymes: No results for input(s): "CKTOTAL", "CKMB", "CKMBINDEX", "TROPONINI" in the last 168 hours. BNP: Invalid input(s): "POCBNP" CBG: Recent Labs  Lab 01/17/23 0727 01/17/23 1132 01/17/23 1648 01/17/23 2130 01/18/23 0842  GLUCAP 157* 141* 129* 196* 126*   D-Dimer No results for input(s): "DDIMER" in the last 72 hours. Hgb A1c No results for input(s): "HGBA1C" in the last 72 hours. Lipid Profile No results for input(s): "CHOL", "HDL", "LDLCALC", "TRIG", "CHOLHDL", "LDLDIRECT" in the last 72 hours. Thyroid function studies No results for input(s): "TSH", "T4TOTAL", "T3FREE", "THYROIDAB" in the  last 72 hours.  Invalid input(s): "FREET3" Anemia work up No results for input(s): "VITAMINB12", "FOLATE", "FERRITIN", "TIBC", "IRON", "RETICCTPCT" in the last 72 hours. Urinalysis    Component Value Date/Time   COLORURINE YELLOW 01/15/2022 1045   APPEARANCEUR Cloudy (A) 01/15/2022 1045   LABSPEC 1.010 01/15/2022 1045   PHURINE 7.0 01/15/2022 1045   GLUCOSEU >=1000 (A) 01/15/2022 1045   HGBUR SMALL (A) 01/15/2022 1045   BILIRUBINUR NEGATIVE 01/15/2022 1045   BILIRUBINUR neg 04/28/2013 1500   KETONESUR NEGATIVE 01/15/2022 1045   PROTEINUR neg 04/28/2013 1500   UROBILINOGEN 0.2 01/15/2022 1045   NITRITE POSITIVE (A) 01/15/2022 1045   LEUKOCYTESUR LARGE (A) 01/15/2022 1045   Sepsis Labs Recent Labs  Lab 01/16/23 1012  WBC 6.1   Microbiology No results found for this or any previous visit (from the past 240 hour(s)).  FURTHER DISCHARGE INSTRUCTIONS:   Get Medicines reviewed and adjusted: Please take all your medications with you for your next visit with your Primary MD   Laboratory/radiological data: Please request your Primary MD to go over all hospital tests and  procedure/radiological results at the follow up, please ask your Primary MD to get all Hospital records sent to his/her office.   In some cases, they will be blood work, cultures and biopsy results pending at the time of your discharge. Please request that your primary care M.D. goes through all the records of your hospital data and follows up on these results.   Also Note the following: If you experience worsening of your admission symptoms, develop shortness of breath, life threatening emergency, suicidal or homicidal thoughts you must seek medical attention immediately by calling 911 or calling your MD immediately  if symptoms less severe.   You must read complete instructions/literature along with all the possible adverse reactions/side effects for all the Medicines you take and that have been prescribed to you. Take any new Medicines after you have completely understood and accpet all the possible adverse reactions/side effects.    Do not drive when taking Pain medications or sleeping medications (Benzodaizepines)   Do not take more than prescribed Pain, Sleep and Anxiety Medications. It is not advisable to combine anxiety,sleep and pain medications without talking with your primary care practitioner   Special Instructions: If you have smoked or chewed Tobacco  in the last 2 yrs please stop smoking, stop any regular Alcohol  and or any Recreational drug use.   Wear Seat belts while driving.   Please note: You were cared for by a hospitalist during your hospital stay. Once you are discharged, your primary care physician will handle any further medical issues. Please note that NO REFILLS for any discharge medications will be authorized once you are discharged, as it is imperative that you return to your primary care physician (or establish a relationship with a primary care physician if you do not have one) for your post hospital discharge needs so that they can reassess your need for  medications and monitor your lab values  Time coordinating discharge: Over 30 minutes  SIGNED:   Hughie Closs, MD  [facility] 01/18/2023, 10:57 AM *Please note that this is a verbal dictation therefore any spelling or grammatical errors are due to the "Dragon Medical One" system interpretation. If 7PM-7AM, please contact night-coverage [email or web address]

## 2023-01-18 NOTE — Progress Notes (Signed)
Patient discharged: Home with family  Via: Wheelchair   Discharge paperwork given: to patient and family  Reviewed with teach back  IV and telemetry disconnected  Belongings given to patient    

## 2023-01-18 NOTE — Progress Notes (Signed)
     Progress Note    ASSESSMENT AND PLAN:   Barrett's stricture s/p dil 10/25 HH Hypertensive emergency  Plan: -Please continue omeprazole 20 mg p.o. twice daily -Follow-up as outpatient with Dr Myrtie Neither -Plan as per EGD note -Will sign off for now.     SUBJECTIVE   Swallowing much better No neck pain Denies having any complaints Would like to go home today    OBJECTIVE:     Vital signs in last 24 hours: Temp:  [97.4 F (36.3 C)-98.6 F (37 C)] 98.2 F (36.8 C) (10/26 0945) Pulse Rate:  [63-76] 69 (10/26 0846) Resp:  [11-21] 20 (10/26 0945) BP: (135-206)/(55-86) 157/65 (10/26 0945) SpO2:  [96 %-100 %] 98 % (10/26 0945) Weight:  [41 kg] 63 kg (10/25 1210) Last BM Date : 01/16/23 General:   Alert, well-developed female in NAD EENT:  Normal hearing, non icteric sclera, conjunctive pink.  Heart:  Regular rate and rhythm; no murmur.  No lower extremity edema   Pulm: Normal respiratory effort, lungs CTA bilaterally without wheezes or crackles. Abdomen:  Soft, nondistended, nontender.  Normal bowel sounds,.       Neurologic:  Alert and  oriented x4;  grossly normal neurologically. Psych:  Pleasant, cooperative.  Normal mood and affect.   Intake/Output from previous day: 10/25 0701 - 10/26 0700 In: 200 [I.V.:200] Out: -  Intake/Output this shift: Total I/O In: 120 [P.O.:120] Out: 300 [Urine:300]  Lab Results: Recent Labs    01/16/23 1012  WBC 6.1  HGB 16.2*  HCT 47.7*  PLT 238   BMET Recent Labs    01/16/23 1012 01/18/23 0921  NA 136 137  K 4.2 3.5  CL 101 104  CO2 25 23  GLUCOSE 183* 170*  BUN 15 13  CREATININE 1.08* 1.12*  CALCIUM 9.2 9.2   LFT No results for input(s): "PROT", "ALBUMIN", "AST", "ALT", "ALKPHOS", "BILITOT", "BILIDIR", "IBILI" in the last 72 hours. PT/INR No results for input(s): "LABPROT", "INR" in the last 72 hours. Hepatitis Panel No results for input(s): "HEPBSAG", "HCVAB", "HEPAIGM", "HEPBIGM" in the last 72  hours.  DG C-Arm 1-60 Min-No Report  Result Date: 01/17/2023 Fluoroscopy was utilized by the requesting physician.  No radiographic interpretation.     Principal Problem:   Hypertensive urgency Active Problems:   Hypertension associated with diabetes (HCC)   Stage 3a chronic kidney disease (HCC)   Esophageal stricture   Barrett esophagus     LOS: 1 day     Edman Circle, MD 01/18/2023, 10:17 AM [facility] [phone]

## 2023-01-18 NOTE — Progress Notes (Signed)
Transition of Care [facility]) - Inpatient Brief Assessment   Patient Details  Name: Carly Moore MRN: [id number] Date of Birth: 18-Feb-1946  Transition of [facility]) CM/SW Contact:    Adrian Prows, RN Phone Number: 01/18/2023, 11:00 AM   Clinical Narrative: Patient identified POC son Carly Moore [phone]); Brief TOC assessment completed.   Transition of Care Asessment: Insurance and Status: Insurance coverage has been reviewed Patient has primary care physician: Yes Home environment has been reviewed: yes Prior level of function:: independent Prior/Current Home Services: No current home services Social Determinants of Health Reivew: SDOH reviewed no interventions necessary Readmission risk has been reviewed: Yes Transition of care needs: no transition of care needs at this time

## 2023-01-20 ENCOUNTER — Telehealth: Payer: Self-pay | Admitting: *Deleted

## 2023-01-20 ENCOUNTER — Encounter (HOSPITAL_COMMUNITY): Payer: Self-pay | Admitting: Gastroenterology

## 2023-01-20 LAB — SURGICAL PATHOLOGY

## 2023-01-20 NOTE — Transitions of Care (Post Inpatient/ED Visit) (Signed)
01/20/2023  Name: Carly Moore MRN: [id number] DOB: [birth year]  Today's TOC FU Call Status: Today's TOC FU Call Status:: Successful TOC FU Call Completed TOC FU Call Complete Date: 01/20/23 Patient's Name and Date of Birth confirmed.  Transition Care Management Follow-up Telephone Call Date of Discharge: 01/18/23 Discharge Facility: [facility]) Type of Discharge: Inpatient Admission Primary Inpatient Discharge Diagnosis:: HTN urgency pre-endoscopy How have you been since you were released from the hospital?: Better ("I am doing fine, not having any issues at all; going today to get a new BP cuff so I can start monitoring my blood pressures at home again.  Thanks for getting me this appointment with Dr. Alvy Bimler scheduled") Any questions or concerns?: No  Items Reviewed: Did you receive and understand the discharge instructions provided?: Yes (thoroughly reviewed with patient who verbalizes good understanding of same) Medications obtained,verified, and reconciled?: Yes (Medications Reviewed) (Full medication reconciliation/ review completed; no concerns or discrepancies identified; confirmed patient obtained/ is taking all newly Rx'd medications as instructed; self-manages medications and denies questions/ concerns around medications today) Any new allergies since your discharge?: No Dietary orders reviewed?: Yes Type of Diet Ordered:: "I have to eat small amounts due to my esophageal stricture" Do you have support at home?: Yes People in Home: alone Name of Support/Comfort Primary Source: Reports resides alone/ independent in self-care activities; supportive family/ friends assists as/ if needed/ indicated  Medications Reviewed Today: Medications Reviewed Today     Reviewed by Michaela Corner, RN (Registered Nurse) on 01/20/23 at 1151  Med List Status: <None>   Medication Order Taking? Sig Documenting Provider Last Dose Status Informant  amLODipine (NORVASC) 10 MG  tablet [id number] Yes Take 1 tablet (10 mg total) by mouth daily. Hughie Closs, MD Taking Active   Berberine Chloride (BERBERINE HCI PO) [id number] Yes Take 1 tablet by mouth daily. [provider] Taking Active Self, Pharmacy Records  Calcium-Magnesium-Vitamin D (CALCIUM MAGNESIUM PO) [id number] Yes Take 1 tablet by mouth daily. [provider] Taking Active Self, Pharmacy Records  JARDIANCE 10 MG TABS tablet [id number] Yes TAKE 1 TABLET BY MOUTH DAILY BEFORE BREAKFAST. Georgina Quint, MD Taking Active Self, Pharmacy Records  lansoprazole (PREVACID) 3 mg/ml SUSP oral suspension [id number] No Take 10 mLs (30 mg total) by mouth 2 (two) times daily.  Patient not taking: Reported on 01/16/2023   Sherrilyn Rist, MD Not Taking Active Self, Pharmacy Records  latanoprost (XALATAN) 0.005 % ophthalmic solution [id number] Yes Place 1 drop into both eyes at bedtime. [provider] Taking Active Self, Pharmacy Records           Med Note Laray Anger Jan 16, 2023 11:50 AM)    losartan (COZAAR) 100 MG tablet [id number] Yes TAKE 1 TABLET BY MOUTH EVERY DAY Sagardia, Eilleen Kempf, MD Taking Active Self, Pharmacy Records  metFORMIN (GLUCOPHAGE) 1000 MG tablet [id number] Yes TAKE 1 TABLET BY MOUTH 2 (TWO) TIMES DAILY WITH A MEAL. Georgina Quint, MD Taking Active Self, Pharmacy Records  OVER THE COUNTER MEDICATION [id number] Yes Take 1 tablet by mouth daily. Super beet [provider] Taking Active Self, Pharmacy Records  timolol (TIMOPTIC) 0.5 % ophthalmic solution [id number] Yes Place 1 drop into both eyes daily. [provider] Taking Active Self, Pharmacy Records  Turmeric (QC TUMERIC COMPLEX PO) [id number] Yes Take 1 tablet by mouth daily. [provider] Taking Active Self, Pharmacy Records           Home  Care and Equipment/Supplies: Were Home Health Services Ordered?: No Any new equipment or medical supplies ordered?:  No  Functional Questionnaire: Do you need assistance with bathing/showering or dressing?: No Do you need assistance with meal preparation?: No Do you need assistance with eating?: No Do you have difficulty maintaining continence: No Do you need assistance with getting out of bed/getting out of a chair/moving?: No Do you have difficulty managing or taking your medications?: No  Follow up appointments reviewed: PCP Follow-up appointment confirmed?: Yes Date of PCP follow-up appointment?: 01/22/23 (care coordination outreach in real-time with scheduling care guide to successfully schedule hospital follow up PCP appointment on 01/22/23) Follow-up Provider: PCP Specialist Hospital Follow-up appointment confirmed?: No Reason Specialist Follow-Up Not Confirmed: Patient has Specialist Provider Number and will Call for Appointment Do you need transportation to your follow-up appointment?: No Do you understand care options if your condition(s) worsen?: Yes-patient verbalized understanding  SDOH Interventions Today    Flowsheet Row Most Recent Value  SDOH Interventions   Food Insecurity Interventions Intervention Not Indicated  Transportation Interventions Intervention Not Indicated  [drives self]      TOC Interventions Today    Flowsheet Row Most Recent Value  TOC Interventions   TOC Interventions Discussed/Reviewed TOC Interventions Discussed, Arranged PCP follow up within 7 days/Care Guide scheduled  [Patient declines need for ongoing/ further care management outreach,  no needs identified at time of TOC call today- declines enrollment in 30-day TOC program,  provided my direct contact information should questions/ concerns/ needs arise post-TOC call]      Interventions Today    Flowsheet Row Most Recent Value  Chronic Disease   Chronic disease during today's visit Hypertension (HTN), Other  [esophageal stricture]  General Interventions   General Interventions Discussed/Reviewed  General Interventions Discussed, Doctor Visits, Durable Medical Equipment (DME)  Doctor Visits Discussed/Reviewed Doctor Visits Discussed, PCP, Specialist  Durable Medical Equipment (DME) Other  [confirmed not currently requiring/ using assistive devices for ambulation]  PCP/Specialist Visits Compliance with follow-up visit  Education Interventions   Education Provided Provided Education  Provided Verbal Education On Medication, Other  [mechanism of action for differing BP meds,  value/ benefit of monitoring/ recording blood pressures at home]  Nutrition Interventions   Nutrition Discussed/Reviewed Nutrition Discussed  Pharmacy Interventions   Pharmacy Dicussed/Reviewed Pharmacy Topics Discussed  [Full medication review with updating medication list in EHR per patient report]  Safety Interventions   Safety Discussed/Reviewed Safety Discussed      Caryl Pina, RN, BSN, CCRN Alumnus RN Care Manager  Transitions of Care  [facility]  [place] [phone]: direct office

## 2023-01-21 ENCOUNTER — Telehealth: Payer: Self-pay | Admitting: Gastroenterology

## 2023-01-21 DIAGNOSIS — H401131 Primary open-angle glaucoma, bilateral, mild stage: Secondary | ICD-10-CM | POA: Diagnosis not present

## 2023-01-21 NOTE — Telephone Encounter (Signed)
Carly Moore,  This patient of mine with an esophageal stricture had an upper endoscopy by Dr. Chales Abrahams while hospitalized last week for control of her hypertension. She is on twice daily acid suppression and needs a repeat upper endoscopy with me in the hospital outpatient endoscopy department in 6 to 8 weeks.  Please contact her and get her on my schedule for 02/27/2023 when I have an outpatient block at [facility]. With the procedure orders and requisition, we need to specify it is a fluoroscopy case.  Thank you  Ellwood Dense, MD

## 2023-01-22 ENCOUNTER — Telehealth: Payer: Self-pay | Admitting: Pharmacy Technician

## 2023-01-22 ENCOUNTER — Ambulatory Visit: Payer: Medicare HMO | Admitting: Emergency Medicine

## 2023-01-22 ENCOUNTER — Encounter: Payer: Self-pay | Admitting: Emergency Medicine

## 2023-01-22 VITALS — BP 148/88 | HR 78 | Temp 98.2°F | Ht 60.0 in | Wt 135.1 lb

## 2023-01-22 DIAGNOSIS — Z7984 Long term (current) use of oral hypoglycemic drugs: Secondary | ICD-10-CM

## 2023-01-22 DIAGNOSIS — Z09 Encounter for follow-up examination after completed treatment for conditions other than malignant neoplasm: Secondary | ICD-10-CM | POA: Diagnosis not present

## 2023-01-22 DIAGNOSIS — N1831 Chronic kidney disease, stage 3a: Secondary | ICD-10-CM

## 2023-01-22 DIAGNOSIS — E785 Hyperlipidemia, unspecified: Secondary | ICD-10-CM

## 2023-01-22 DIAGNOSIS — E1169 Type 2 diabetes mellitus with other specified complication: Secondary | ICD-10-CM

## 2023-01-22 DIAGNOSIS — I152 Hypertension secondary to endocrine disorders: Secondary | ICD-10-CM | POA: Diagnosis not present

## 2023-01-22 DIAGNOSIS — E1159 Type 2 diabetes mellitus with other circulatory complications: Secondary | ICD-10-CM | POA: Diagnosis not present

## 2023-01-22 LAB — COMPREHENSIVE METABOLIC PANEL
ALT: 17 U/L (ref 0–35)
AST: 22 U/L (ref 0–37)
Albumin: 4.4 g/dL (ref 3.5–5.2)
Alkaline Phosphatase: 92 U/L (ref 39–117)
BUN: 17 mg/dL (ref 6–23)
CO2: 28 meq/L (ref 19–32)
Calcium: 10.1 mg/dL (ref 8.4–10.5)
Chloride: 101 meq/L (ref 96–112)
Creatinine, Ser: 1.23 mg/dL — ABNORMAL HIGH (ref 0.40–1.20)
GFR: 42.16 mL/min — ABNORMAL LOW (ref >60.00)
Glucose, Bld: 159 mg/dL — ABNORMAL HIGH (ref 70–99)
Potassium: 3.9 meq/L (ref 3.5–5.1)
Sodium: 137 meq/L (ref 135–145)
Total Bilirubin: 0.6 mg/dL (ref 0.2–1.2)
Total Protein: 8 g/dL (ref 6.0–8.3)

## 2023-01-22 LAB — POCT GLYCOSYLATED HEMOGLOBIN (HGB A1C): Hemoglobin A1C: 8 % — AB (ref 4.0–5.6)

## 2023-01-22 LAB — CBC WITH DIFFERENTIAL/PLATELET
Basophils Absolute: 0.1 10*3/uL (ref 0.0–0.1)
Basophils Relative: 0.8 % (ref 0.0–3.0)
Eosinophils Absolute: 0.2 10*3/uL (ref 0.0–0.7)
Eosinophils Relative: 2.6 % (ref 0.0–5.0)
HCT: 48.1 % — ABNORMAL HIGH (ref 36.0–46.0)
Hemoglobin: 15.8 g/dL — ABNORMAL HIGH (ref 12.0–15.0)
Lymphocytes Relative: 28.4 % (ref 12.0–46.0)
Lymphs Abs: 2.3 10*3/uL (ref 0.7–4.0)
MCHC: 32.9 g/dL (ref 30.0–36.0)
MCV: 94.6 fL (ref 78.0–100.0)
Monocytes Absolute: 0.3 10*3/uL (ref 0.1–1.0)
Monocytes Relative: 4.2 % (ref 3.0–12.0)
Neutro Abs: 5.1 10*3/uL (ref 1.4–7.7)
Neutrophils Relative %: 64 % (ref 43.0–77.0)
Platelets: 340 10*3/uL (ref 150.0–400.0)
RBC: 5.08 Mil/uL (ref 3.87–5.11)
RDW: 13.5 % (ref 11.5–15.5)
WBC: 8 10*3/uL (ref 4.0–10.5)

## 2023-01-22 LAB — MICROALBUMIN / CREATININE URINE RATIO
Creatinine,U: 38.1 mg/dL
Microalb Creat Ratio: 4.8 mg/g (ref 0.0–30.0)
Microalb, Ur: 1.8 mg/dL (ref 0.0–1.9)

## 2023-01-22 MED ORDER — NEBIVOLOL HCL 5 MG PO TABS: 5.0000 mg | ORAL_TABLET | Freq: Every day | ORAL | 3 refills | Status: DC

## 2023-01-22 NOTE — Assessment & Plan Note (Signed)
Uncontrolled hypertension with elevated blood pressure readings in the office and at home Cardiovascular risks associated with hypertension discussed Recently started on amlodipine 10 mg daily Continue losartan 100 mg daily May need third medication.  Recommend Bystolic Advised to continue monitoring blood pressure readings at home daily for the next several weeks and keep a log.  Advised to contact the office if numbers persistently abnormal.

## 2023-01-22 NOTE — Assessment & Plan Note (Addendum)
Hemoglobin A1c at 8.0, still not at goal Continue metformin 1000 mg twice a day and Jardiance 10 mg daily Diet and nutrition discussed Cardiovascular risks associated with diabetes discussed

## 2023-01-22 NOTE — Patient Instructions (Signed)
Hypertension, Adult High blood pressure (hypertension) is when the force of blood pumping through the arteries is too strong. The arteries are the blood vessels that carry blood from the heart throughout the body. Hypertension forces the heart to work harder to pump blood and may cause arteries to become narrow or stiff. Untreated or uncontrolled hypertension can lead to a heart attack, heart failure, a stroke, kidney disease, and other problems. A blood pressure reading consists of a higher number over a lower number. Ideally, your blood pressure should be below 120/80. The first ("top") number is called the systolic pressure. It is a measure of the pressure in your arteries as your heart beats. The second ("bottom") number is called the diastolic pressure. It is a measure of the pressure in your arteries as the heart relaxes. What are the causes? The exact cause of this condition is not known. There are some conditions that result in high blood pressure. What increases the risk? Certain factors may make you more likely to develop high blood pressure. Some of these risk factors are under your control, including: Smoking. Not getting enough exercise or physical activity. Being overweight. Having too much fat, sugar, calories, or salt (sodium) in your diet. Drinking too much alcohol. Other risk factors include: Having a personal history of heart disease, diabetes, high cholesterol, or kidney disease. Stress. Having a family history of high blood pressure and high cholesterol. Having obstructive sleep apnea. Age. The risk increases with age. What are the signs or symptoms? High blood pressure may not cause symptoms. Very high blood pressure (hypertensive crisis) may cause: Headache. Fast or irregular heartbeats (palpitations). Shortness of breath. Nosebleed. Nausea and vomiting. Vision changes. Severe chest pain, dizziness, and seizures. How is this diagnosed? This condition is diagnosed by  measuring your blood pressure while you are seated, with your arm resting on a flat surface, your legs uncrossed, and your feet flat on the floor. The cuff of the blood pressure monitor will be placed directly against the skin of your upper arm at the level of your heart. Blood pressure should be measured at least twice using the same arm. Certain conditions can cause a difference in blood pressure between your right and left arms. If you have a high blood pressure reading during one visit or you have normal blood pressure with other risk factors, you may be asked to: Return on a different day to have your blood pressure checked again. Monitor your blood pressure at home for 1 week or longer. If you are diagnosed with hypertension, you may have other blood or imaging tests to help your health care provider understand your overall risk for other conditions. How is this treated? This condition is treated by making healthy lifestyle changes, such as eating healthy foods, exercising more, and reducing your alcohol intake. You may be referred for counseling on a healthy diet and physical activity. Your health care provider may prescribe medicine if lifestyle changes are not enough to get your blood pressure under control and if: Your systolic blood pressure is above 130. Your diastolic blood pressure is above 80. Your personal target blood pressure may vary depending on your medical conditions, your age, and other factors. Follow these instructions at home: Eating and drinking  Eat a diet that is high in fiber and potassium, and low in sodium, added sugar, and fat. An example of this eating plan is called the DASH diet. DASH stands for Dietary Approaches to Stop Hypertension. To eat this way: Eat   plenty of fresh fruits and vegetables. Try to fill one half of your plate at each meal with fruits and vegetables. Eat whole grains, such as whole-wheat pasta, brown rice, or whole-grain bread. Fill about one  fourth of your plate with whole grains. Eat or drink low-fat dairy products, such as skim milk or low-fat yogurt. Avoid fatty cuts of meat, processed or cured meats, and poultry with skin. Fill about one fourth of your plate with lean proteins, such as fish, chicken without skin, beans, eggs, or tofu. Avoid pre-made and processed foods. These tend to be higher in sodium, added sugar, and fat. Reduce your daily sodium intake. Many people with hypertension should eat less than 1,500 mg of sodium a day. Do not drink alcohol if: Your health care provider tells you not to drink. You are pregnant, may be pregnant, or are planning to become pregnant. If you drink alcohol: Limit how much you have to: 0-1 drink a day for women. 0-2 drinks a day for men. Know how much alcohol is in your drink. In the [place] one drink equals one 12 oz bottle of beer (355 mL), one 5 oz glass of wine (148 mL), or one 1 oz glass of hard liquor (44 mL). Lifestyle  Work with your health care provider to maintain a healthy body weight or to lose weight. Ask what an ideal weight is for you. Get at least 30 minutes of exercise that causes your heart to beat faster (aerobic exercise) most days of the week. Activities may include walking, swimming, or biking. Include exercise to strengthen your muscles (resistance exercise), such as Pilates or lifting weights, as part of your weekly exercise routine. Try to do these types of exercises for 30 minutes at least 3 days a week. Do not use any products that contain nicotine or tobacco. These products include cigarettes, chewing tobacco, and vaping devices, such as e-cigarettes. If you need help quitting, ask your health care provider. Monitor your blood pressure at home as told by your health care provider. Keep all follow-up visits. This is important. Medicines Take over-the-counter and prescription medicines only as told by your health care provider. Follow directions carefully. Blood  pressure medicines must be taken as prescribed. Do not skip doses of blood pressure medicine. Doing this puts you at risk for problems and can make the medicine less effective. Ask your health care provider about side effects or reactions to medicines that you should watch for. Contact a health care provider if you: Think you are having a reaction to a medicine you are taking. Have headaches that keep coming back (recurring). Feel dizzy. Have swelling in your ankles. Have trouble with your vision. Get help right away if you: Develop a severe headache or confusion. Have unusual weakness or numbness. Feel faint. Have severe pain in your chest or abdomen. Vomit repeatedly. Have trouble breathing. These symptoms may be an emergency. Get help right away. Call 911. Do not wait to see if the symptoms will go away. Do not drive yourself to the hospital. Summary Hypertension is when the force of blood pumping through your arteries is too strong. If this condition is not controlled, it may put you at risk for serious complications. Your personal target blood pressure may vary depending on your medical conditions, your age, and other factors. For most people, a normal blood pressure is less than 120/80. Hypertension is treated with lifestyle changes, medicines, or a combination of both. Lifestyle changes include losing weight, eating a healthy,   low-sodium diet, exercising more, and limiting alcohol. This information is not intended to replace advice given to you by your health care provider. Make sure you discuss any questions you have with your health care provider. Document Revised: 01/16/2021 Document Reviewed: 01/16/2021 Elsevier Patient Education  2024 [employer]  

## 2023-01-22 NOTE — Assessment & Plan Note (Signed)
Advised to stay well-hydrated and avoid NSAIDs Continue Jardiance 10 mg daily

## 2023-01-22 NOTE — Progress Notes (Signed)
Carly Moore 77 y.o.   Chief Complaint  Patient presents with   Medical Management of Chronic Issues    Patient was seen in th ED, patient states she had high BP.     HISTORY OF PRESENT ILLNESS: This is a 77 y.o. female here for hospital discharge follow-up. Patient was recently admitted with uncontrolled hypertension before undergoing upper endoscopy for esophageal stricture Procedure went well.  Patient was started on amlodipine 10 mg along with her daily losartan 100 mg Was released on 01/18/2023.  Blood pressure readings have been elevated at home. Also history of diabetes.  On metformin and Jardiance but states [place] Pepper is too expensive without coverage.  Has donut hole issues at present time. No other complaints or medical concerns today.  HPI   Prior to Admission medications   Medication Sig Start Date End Date Taking? Authorizing Provider  amLODipine (NORVASC) 10 MG tablet Take 1 tablet (10 mg total) by mouth daily. 01/18/23 02/17/23 Yes Pahwani, Daleen Bo, MD  Berberine Chloride (BERBERINE HCI PO) Take 1 tablet by mouth daily.   Yes [provider]  Calcium-Magnesium-Vitamin D (CALCIUM MAGNESIUM PO) Take 1 tablet by mouth daily.   Yes [provider]  JARDIANCE 10 MG TABS tablet TAKE 1 TABLET BY MOUTH DAILY BEFORE BREAKFAST. 10/21/22  Yes Katrece Roediger, Eilleen Kempf, MD  latanoprost (XALATAN) 0.005 % ophthalmic solution Place 1 drop into both eyes at bedtime. 02/18/15  Yes [provider]  losartan (COZAAR) 100 MG tablet TAKE 1 TABLET BY MOUTH EVERY DAY 10/21/22  Yes Aveen Stansel, Eilleen Kempf, MD  metFORMIN (GLUCOPHAGE) 1000 MG tablet TAKE 1 TABLET BY MOUTH 2 (TWO) TIMES DAILY WITH A MEAL. 10/21/22  Yes Allen Basista, Eilleen Kempf, MD  OVER THE COUNTER MEDICATION Take 1 tablet by mouth daily. Super beet   Yes [provider]  timolol (TIMOPTIC) 0.5 % ophthalmic solution Place 1 drop into both eyes daily. 08/14/22  Yes [provider]  Turmeric (QC  TUMERIC COMPLEX PO) Take 1 tablet by mouth daily.   Yes [provider]    Allergies  Allergen Reactions   Sulfa Antibiotics Rash    Patient Active Problem List   Diagnosis Date Noted   Esophageal stricture 01/18/2023   Barrett esophagus 01/18/2023   Hypertensive urgency 01/16/2023   Dyslipidemia associated with type 2 diabetes mellitus (HCC) 01/15/2022   Stage 3a chronic kidney disease (HCC) 01/15/2022   History of glaucoma 11/09/2019   Vitamin D deficiency 06/11/2019   Noncompliance with medications 06/11/2019   Glaucoma    Hypertension associated with diabetes (HCC)    Diabetes type 2, uncontrolled    Dyslipidemia     Past Medical History:  Diagnosis Date   Diabetes mellitus without complication (HCC)    Phreesia 09/13/2019   Diabetes type 2, controlled (HCC)    Elevated cholesterol    Glaucoma    Hyperlipidemia    Hypertension    Phreesia 09/13/2019   Presbyopia    UTI (urinary tract infection)     Past Surgical History:  Procedure Laterality Date   BALLOON DILATION N/A 01/17/2023   Procedure: BALLOON DILATION;  Surgeon: Lynann Bologna, MD;  Location: [facility];  Service: Gastroenterology;  Laterality: N/A;   BIOPSY  01/17/2023   Procedure: BIOPSY;  Surgeon: Lynann Bologna, MD;  Location: [facility];  Service: Gastroenterology;;   ESOPHAGOGASTRODUODENOSCOPY (EGD) WITH PROPOFOL N/A 01/17/2023   Procedure: ESOPHAGOGASTRODUODENOSCOPY (EGD) WITH PROPOFOL;  Surgeon: Lynann Bologna, MD;  Location: [facility];  Service: Gastroenterology;  Laterality: N/A;  EYE SURGERY N/A    Phreesia 09/13/2019    Social History   Socioeconomic History   Marital status: Single    Spouse name: Not on file   Number of children: 1   Years of education: Not on file   Highest education level: Associate degree: occupational, Scientist, product/process development, or vocational program  Occupational History   Occupation: Engineer, agricultural   Occupation: retired  Tobacco Use   Smoking status: Never    Smokeless tobacco: Never  Vaping Use   Vaping status: Never Used  Substance and Sexual Activity   Alcohol use: Not Currently   Drug use: No   Sexual activity: Not Currently  Other Topics Concern   Not on file  Social History Narrative   Single   1 son   Social Determinants of Health   Financial Resource Strain: Low Risk  (09/23/2022)   Overall Financial Resource Strain (CARDIA)    Difficulty of Paying Living Expenses: Not hard at all  Food Insecurity: No Food Insecurity (01/20/2023)   Hunger Vital Sign    Worried About Running Out of Food in the Last Year: Never true    Ran Out of Food in the Last Year: Never true  Transportation Needs: No Transportation Needs (01/20/2023)   PRAPARE - Administrator, Civil Service (Medical): No    Lack of Transportation (Non-Medical): No  Physical Activity: Insufficiently Active (09/23/2022)   Exercise Vital Sign    Days of Exercise per Week: 1 day    Minutes of Exercise per Session: 30 min  Stress: No Stress Concern Present (09/23/2022)   [employer] of Occupational Health - Occupational Stress Questionnaire    Feeling of Stress : Not at all  Social Connections: Unknown (09/23/2022)   Social Connection and Isolation Panel [NHANES]    Frequency of Communication with Friends and Family: More than three times a week    Frequency of Social Gatherings with Friends and Family: Once a week    Attends Religious Services: Patient declined    Database administrator or Organizations: No    Attends Banker Meetings: Never    Marital Status: Never married  Intimate Partner Violence: Not At Risk (01/18/2023)   Humiliation, Afraid, Rape, and Kick questionnaire    Fear of Current or Ex-Partner: No    Emotionally Abused: No    Physically Abused: No    Sexually Abused: No    Family History  Problem Relation Age of Onset   Heart disease Mother    Cancer Father    Diabetes Father    Alcohol abuse Brother    Colon cancer  Neg Hx    Stomach cancer Neg Hx    Rectal cancer Neg Hx    Esophageal cancer Neg Hx      Review of Systems  Constitutional: Negative.  Negative for chills and fever.  HENT: Negative.  Negative for congestion and sore throat.   Respiratory: Negative.  Negative for cough and shortness of breath.   Cardiovascular: Negative.  Negative for chest pain and palpitations.  Gastrointestinal:  Negative for abdominal pain, diarrhea, nausea and vomiting.  Genitourinary: Negative.  Negative for dysuria and hematuria.  Skin: Negative.  Negative for rash.  Neurological: Negative.  Negative for dizziness and headaches.  All other systems reviewed and are negative.   Vitals:   01/22/23 1411  BP: (!) 148/88  Pulse: 78  Temp: 98.2 F (36.8 C)  SpO2: 96%    Physical Exam Vitals reviewed.  Constitutional:      Appearance: Normal appearance.  HENT:     Head: Normocephalic.     Mouth/Throat:     Mouth: Mucous membranes are moist.     Pharynx: Oropharynx is clear.  Eyes:     Extraocular Movements: Extraocular movements intact.     Pupils: Pupils are equal, round, and reactive to light.  Cardiovascular:     Rate and Rhythm: Normal rate and regular rhythm.     Pulses: Normal pulses.     Heart sounds: Normal heart sounds.  Pulmonary:     Effort: Pulmonary effort is normal.     Breath sounds: Normal breath sounds.  Abdominal:     Palpations: Abdomen is soft.     Tenderness: There is no abdominal tenderness.  Musculoskeletal:     Cervical back: No tenderness.  Lymphadenopathy:     Cervical: No cervical adenopathy.  Skin:    General: Skin is warm and dry.     Capillary Refill: Capillary refill takes less than 2 seconds.  Neurological:     General: No focal deficit present.     Mental Status: She is alert and oriented to person, place, and time.  Psychiatric:        Mood and Affect: Mood normal.        Behavior: Behavior normal.    Results for orders placed or performed in visit on  01/22/23 (from the past 24 hour(s))  POCT HgB A1C     Status: Abnormal   Collection Time: 01/22/23  2:29 PM  Result Value Ref Range   Hemoglobin A1C 8.0 (A) 4.0 - 5.6 %   HbA1c POC (<> result, manual entry)     HbA1c, POC (prediabetic range)     HbA1c, POC (controlled diabetic range)        ASSESSMENT & PLAN: A total of 44 minutes was spent with the patient and counseling/coordination of care regarding preparing for this visit, review of most recent office visit notes, review of most recent hospital discharge summary, review of multiple chronic medical conditions under management, review of all medications and changes made, cardiovascular risks associated with uncontrolled hypertension and diabetes, education on nutrition, prognosis, documentation and need for follow-up.  Problem List Items Addressed This Visit       Cardiovascular and Mediastinum   Hypertension associated with diabetes (HCC) - Primary    Uncontrolled hypertension with elevated blood pressure readings in the office and at home Cardiovascular risks associated with hypertension discussed Recently started on amlodipine 10 mg daily Continue losartan 100 mg daily May need third medication.  Recommend Bystolic Advised to continue monitoring blood pressure readings at home daily for the next several weeks and keep a log.  Advised to contact the office if numbers persistently abnormal.      Relevant Medications   nebivolol (BYSTOLIC) 5 MG tablet   Other Relevant Orders   POCT HgB A1C (Completed)   CBC with Differential/Platelet   Comprehensive metabolic panel   Urine Microalbumin w/creat. ratio     Endocrine   Dyslipidemia associated with type 2 diabetes mellitus (HCC)    Hemoglobin A1c at 8.0, still not at goal Continue metformin 1000 mg twice a day and Jardiance 10 mg daily Diet and nutrition discussed Cardiovascular risks associated with diabetes discussed      Relevant Orders   Urine Microalbumin w/creat.  ratio     Genitourinary   Stage 3a chronic kidney disease (HCC)    Advised to stay well-hydrated and avoid  NSAIDs Continue Jardiance 10 mg daily      Other Visit Diagnoses     Hospital discharge follow-up            Patient Instructions  Hypertension, Adult High blood pressure (hypertension) is when the force of blood pumping through the arteries is too strong. The arteries are the blood vessels that carry blood from the heart throughout the body. Hypertension forces the heart to work harder to pump blood and may cause arteries to become narrow or stiff. Untreated or uncontrolled hypertension can lead to a heart attack, heart failure, a stroke, kidney disease, and other problems. A blood pressure reading consists of a higher number over a lower number. Ideally, your blood pressure should be below 120/80. The first ("top") number is called the systolic pressure. It is a measure of the pressure in your arteries as your heart beats. The second ("bottom") number is called the diastolic pressure. It is a measure of the pressure in your arteries as the heart relaxes. What are the causes? The exact cause of this condition is not known. There are some conditions that result in high blood pressure. What increases the risk? Certain factors may make you more likely to develop high blood pressure. Some of these risk factors are under your control, including: Smoking. Not getting enough exercise or physical activity. Being overweight. Having too much fat, sugar, calories, or salt (sodium) in your diet. Drinking too much alcohol. Other risk factors include: Having a personal history of heart disease, diabetes, high cholesterol, or kidney disease. Stress. Having a family history of high blood pressure and high cholesterol. Having obstructive sleep apnea. Age. The risk increases with age. What are the signs or symptoms? High blood pressure may not cause symptoms. Very high blood pressure  (hypertensive crisis) may cause: Headache. Fast or irregular heartbeats (palpitations). Shortness of breath. Nosebleed. Nausea and vomiting. Vision changes. Severe chest pain, dizziness, and seizures. How is this diagnosed? This condition is diagnosed by measuring your blood pressure while you are seated, with your arm resting on a flat surface, your legs uncrossed, and your feet flat on the floor. The cuff of the blood pressure monitor will be placed directly against the skin of your upper arm at the level of your heart. Blood pressure should be measured at least twice using the same arm. Certain conditions can cause a difference in blood pressure between your right and left arms. If you have a high blood pressure reading during one visit or you have normal blood pressure with other risk factors, you may be asked to: Return on a different day to have your blood pressure checked again. Monitor your blood pressure at home for 1 week or longer. If you are diagnosed with hypertension, you may have other blood or imaging tests to help your health care provider understand your overall risk for other conditions. How is this treated? This condition is treated by making healthy lifestyle changes, such as eating healthy foods, exercising more, and reducing your alcohol intake. You may be referred for counseling on a healthy diet and physical activity. Your health care provider may prescribe medicine if lifestyle changes are not enough to get your blood pressure under control and if: Your systolic blood pressure is above 130. Your diastolic blood pressure is above 80. Your personal target blood pressure may vary depending on your medical conditions, your age, and other factors. Follow these instructions at home: Eating and drinking  Eat a diet that is high in  fiber and potassium, and low in sodium, added sugar, and fat. An example of this eating plan is called the DASH diet. DASH stands for Dietary  Approaches to Stop Hypertension. To eat this way: Eat plenty of fresh fruits and vegetables. Try to fill one half of your plate at each meal with fruits and vegetables. Eat whole grains, such as whole-wheat pasta, brown rice, or whole-grain bread. Fill about one fourth of your plate with whole grains. Eat or drink low-fat dairy products, such as skim milk or low-fat yogurt. Avoid fatty cuts of meat, processed or cured meats, and poultry with skin. Fill about one fourth of your plate with lean proteins, such as fish, chicken without skin, beans, eggs, or tofu. Avoid pre-made and processed foods. These tend to be higher in sodium, added sugar, and fat. Reduce your daily sodium intake. Many people with hypertension should eat less than 1,500 mg of sodium a day. Do not drink alcohol if: Your health care provider tells you not to drink. You are pregnant, may be pregnant, or are planning to become pregnant. If you drink alcohol: Limit how much you have to: 0-1 drink a day for women. 0-2 drinks a day for men. Know how much alcohol is in your drink. In the [place] one drink equals one 12 oz bottle of beer (355 mL), one 5 oz glass of wine (148 mL), or one 1 oz glass of hard liquor (44 mL). Lifestyle  Work with your health care provider to maintain a healthy body weight or to lose weight. Ask what an ideal weight is for you. Get at least 30 minutes of exercise that causes your heart to beat faster (aerobic exercise) most days of the week. Activities may include walking, swimming, or biking. Include exercise to strengthen your muscles (resistance exercise), such as Pilates or lifting weights, as part of your weekly exercise routine. Try to do these types of exercises for 30 minutes at least 3 days a week. Do not use any products that contain nicotine or tobacco. These products include cigarettes, chewing tobacco, and vaping devices, such as e-cigarettes. If you need help quitting, ask your health care  provider. Monitor your blood pressure at home as told by your health care provider. Keep all follow-up visits. This is important. Medicines Take over-the-counter and prescription medicines only as told by your health care provider. Follow directions carefully. Blood pressure medicines must be taken as prescribed. Do not skip doses of blood pressure medicine. Doing this puts you at risk for problems and can make the medicine less effective. Ask your health care provider about side effects or reactions to medicines that you should watch for. Contact a health care provider if you: Think you are having a reaction to a medicine you are taking. Have headaches that keep coming back (recurring). Feel dizzy. Have swelling in your ankles. Have trouble with your vision. Get help right away if you: Develop a severe headache or confusion. Have unusual weakness or numbness. Feel faint. Have severe pain in your chest or abdomen. Vomit repeatedly. Have trouble breathing. These symptoms may be an emergency. Get help right away. Call 911. Do not wait to see if the symptoms will go away. Do not drive yourself to the hospital. Summary Hypertension is when the force of blood pumping through your arteries is too strong. If this condition is not controlled, it may put you at risk for serious complications. Your personal target blood pressure may vary depending on your medical conditions, your  age, and other factors. For most people, a normal blood pressure is less than 120/80. Hypertension is treated with lifestyle changes, medicines, or a combination of both. Lifestyle changes include losing weight, eating a healthy, low-sodium diet, exercising more, and limiting alcohol. This information is not intended to replace advice given to you by your health care provider. Make sure you discuss any questions you have with your health care provider. Document Revised: 01/16/2021 Document Reviewed: 01/16/2021 Elsevier  Patient Education  2024 Elsevier Inc.      Edwina Barth, MD [facility]

## 2023-01-22 NOTE — Telephone Encounter (Signed)
Pharmacy Patient Advocate Encounter   Received notification from CoverMyMeds that prior authorization for First-Lansoprazole 3MG /ML suspension is required/requested.   Insurance verification completed.   The patient is insured through [facility] .   Per test claim: CANCELLED due to

## 2023-01-23 ENCOUNTER — Other Ambulatory Visit: Payer: Self-pay

## 2023-01-23 DIAGNOSIS — R1314 Dysphagia, pharyngoesophageal phase: Secondary | ICD-10-CM

## 2023-01-23 DIAGNOSIS — K222 Esophageal obstruction: Secondary | ICD-10-CM

## 2023-01-23 NOTE — Telephone Encounter (Signed)
Scheduled EGD at [facility] with Dr. Myrtie Neither for 02/27/23 at 10:35 am. Discussed MD recommendations & appointment date with patient. She asked that I clarify with Dr. Myrtie Neither if procedure is needed given her symptoms have improved, and does not recall being told this in the hospital that she needed another. States she is no longer having difficulty swallowing, reflux, or heart burn symptoms.

## 2023-01-23 NOTE — Telephone Encounter (Signed)
Left message for patient to call back  

## 2023-01-23 NOTE — Telephone Encounter (Signed)
I will leave the decision up to her, but the stricture will most likely need further dilation after period of time on acid suppression.  Reflux induced strictures of this severity tend to have initial improvement after dilation but slowly narrow back down to some degree in the following weeks.  We also should confirm endoscopically that the severe reflux esophagitis has improved on medicines.  But if she would rather put that off until later time, that is okay with me, understanding that hospital-based endoscopy slots are typically booking 2 to 3 months out..  If she decides to do hold off on the endoscopy, then she should see me or one of our APP's in clinic in December.  (And you could look ahead to a date that is a 7-day hold, keep her on the list, and then release the spot the week prior).  Ellwood Dense, MD

## 2023-01-23 NOTE — Telephone Encounter (Signed)
Refer to alternate phone note 01/21/23.

## 2023-01-24 ENCOUNTER — Other Ambulatory Visit: Payer: Self-pay

## 2023-01-24 ENCOUNTER — Encounter: Payer: Self-pay | Admitting: Gastroenterology

## 2023-01-24 MED ORDER — OMEPRAZOLE 20 MG PO CPDR: 20.0000 mg | DELAYED_RELEASE_CAPSULE | Freq: Two times a day (BID) | ORAL | 2 refills | Status: DC

## 2023-01-24 NOTE — Telephone Encounter (Signed)
Patient was unable to pick up this medication. Insurance did not cover it & it was going to be $500. Dr. Myrtie Neither made aware in separate phone note.

## 2023-01-24 NOTE — Telephone Encounter (Signed)
Spoke with patient regarding MD recommendations. Previously discussed January date & was not able to do this date. Prescription sent to pharmacy. She says she does not like taking medication & would prefer to research medication. Advised her to call back with any questions/concerns. Pt verbalized all understanding.

## 2023-01-24 NOTE — Telephone Encounter (Addendum)
Thank you for the update.  Please note that I also have a hospital outpatient procedure block available in January if she wants to move up to them.   As far as medicine goes, this problem has occurred because of severe gastroesophageal reflux that absolutely needs to be on acid suppression medicine in my opinion.  Without acid suppression medicine this problem will recur and the stricture will probably worsen again.  I understand there were cost issues with the special formulation of lansoprazole.  Please see Dr. Urban Gibson upper endoscopy report for his recommendation (that I agree with) on the use of capsule form of omeprazole.  She would open the capsules and sprinkle the medicine onto applesauce or yogurt and take it that way twice a day.   Prescription for that can be sent to her pharmacy if it was not already done at the time of hospital discharge.  Ellwood Dense MD

## 2023-01-24 NOTE — Telephone Encounter (Signed)
Spoke with patient regarding MD recommendations. She is okay with proceeding with EGD, but would prefer it be done at beginning of the year. EGD moved to 04/29/23 at 7:30 am with Dr. Myrtie Neither. Amb ref placed & instructions sent to patient. She did not want to schedule an OV. Advised she call us if symptoms return or has any concerns. We also discussed her lansoprazole that was sent in last week to [facility] Insurance did not cover this medication, it was $500 so she did not pick it up. States she's not on any reflux medications now & feels that it's not necessary now that her symptoms have improved.

## 2023-02-10 ENCOUNTER — Telehealth: Payer: Self-pay | Admitting: Emergency Medicine

## 2023-02-10 ENCOUNTER — Encounter: Payer: Self-pay | Admitting: Radiology

## 2023-02-10 ENCOUNTER — Other Ambulatory Visit: Payer: Self-pay | Admitting: Radiology

## 2023-02-10 DIAGNOSIS — I10 Essential (primary) hypertension: Secondary | ICD-10-CM

## 2023-02-10 MED ORDER — AMLODIPINE BESYLATE 10 MG PO TABS: 10.0000 mg | ORAL_TABLET | Freq: Every day | ORAL | 0 refills | Status: DC

## 2023-02-10 NOTE — Telephone Encounter (Signed)
Okay to refill.  Thanks.

## 2023-02-10 NOTE — Telephone Encounter (Signed)
Prescription Request  02/10/2023  LOV: 01/22/2023  What is the name of the medication or equipment? Amlodopine - patient is leaving to go out of state - she will not have enough medication to get her through thanksgiving week - she is leaving this Wednesday  Have you contacted your pharmacy to request a refill? Yes   Which pharmacy would you like this sent to?   [id number] [place], [place] - [address] [place] [address] Phone: [phone] Fax: [phone]     Patient notified that their request is being sent to the clinical staff for review and that they should receive a response within 2 business days.   Please advise at Mobile [phone] (mobile)

## 2023-02-19 ENCOUNTER — Telehealth: Payer: Self-pay | Admitting: Gastroenterology

## 2023-02-19 NOTE — Telephone Encounter (Signed)
Left message for pt to call back  °

## 2023-02-19 NOTE — Telephone Encounter (Signed)
Inbound call from patient stating she will be out of town 12/4. States she was offered a January appointment at the time appointment was made. Patient is requesting a call back to discuss if appointment is still available. Please advise, thank you.

## 2023-02-24 NOTE — Telephone Encounter (Signed)
Inbound call from patient returning phone call. Requesting a call back on cell phone number. Please advise, thank you.

## 2023-02-25 ENCOUNTER — Telehealth: Payer: Self-pay

## 2023-02-25 ENCOUNTER — Other Ambulatory Visit: Payer: Self-pay

## 2023-02-25 NOTE — Telephone Encounter (Signed)
RN returned call to patient. Patient states she is waiting to hear back about her follow up appointment with Dr. Myrtie Neither. She had cancelled a follow up appointment for 02/26/23 due to being out of town. RN unable to find availability for appointment in January as patient requested. RN told patient she would communicate with Dr. Myrtie Neither' RN about scheduling this follow up appointment.

## 2023-02-25 NOTE — Telephone Encounter (Signed)
Pt stated that she had to cancel the 02/26/2023 appointment with Dr. Myrtie Neither. Pt was rescheduled to see Dr. Myrtie Neither on 03/30/2022 at 1:20 PM. Pt made aware. Pt verbalized understanding with all questions answered.

## 2023-02-26 ENCOUNTER — Ambulatory Visit: Payer: Medicare HMO | Admitting: Gastroenterology

## 2023-02-27 NOTE — Telephone Encounter (Signed)
Refer to phone note 02/19/23. Viviann Spare, RN contacted & scheduled patient for appointment.

## 2023-03-08 ENCOUNTER — Other Ambulatory Visit: Payer: Self-pay | Admitting: Emergency Medicine

## 2023-03-08 DIAGNOSIS — I10 Essential (primary) hypertension: Secondary | ICD-10-CM

## 2023-03-31 ENCOUNTER — Encounter: Payer: Self-pay | Admitting: Gastroenterology

## 2023-03-31 ENCOUNTER — Ambulatory Visit: Payer: Medicare HMO | Admitting: Gastroenterology

## 2023-03-31 ENCOUNTER — Other Ambulatory Visit: Payer: Self-pay | Admitting: Gastroenterology

## 2023-03-31 VITALS — BP 154/60 | HR 64 | Ht 60.0 in | Wt 144.5 lb

## 2023-03-31 DIAGNOSIS — K2100 Gastro-esophageal reflux disease with esophagitis, without bleeding: Secondary | ICD-10-CM

## 2023-03-31 DIAGNOSIS — K2270 Barrett's esophagus without dysplasia: Secondary | ICD-10-CM | POA: Diagnosis not present

## 2023-03-31 DIAGNOSIS — K222 Esophageal obstruction: Secondary | ICD-10-CM | POA: Diagnosis not present

## 2023-03-31 DIAGNOSIS — K219 Gastro-esophageal reflux disease without esophagitis: Secondary | ICD-10-CM

## 2023-03-31 DIAGNOSIS — R1319 Other dysphagia: Secondary | ICD-10-CM

## 2023-03-31 DIAGNOSIS — R1310 Dysphagia, unspecified: Secondary | ICD-10-CM | POA: Diagnosis not present

## 2023-03-31 MED ORDER — OMEPRAZOLE 20 MG PO CPDR: 20.0000 mg | DELAYED_RELEASE_CAPSULE | Freq: Two times a day (BID) | ORAL | 2 refills | Status: DC

## 2023-03-31 NOTE — Patient Instructions (Signed)
  We have sent the following medications to your pharmacy for you to pick up at your convenience: Omeprazole     We have moved your procedure date to March 17th at 7:30am  _______________________________________________________  If your blood pressure at your visit was 140/90 or greater, please contact your primary care physician to follow up on this.  _______________________________________________________  If you are age 79 or older, your body mass index should be between 23-30. Your Body mass index is 28.22 kg/m. If this is out of the aforementioned range listed, please consider follow up with your Primary Care Provider.  If you are age 21 or younger, your body mass index should be between 19-25. Your Body mass index is 28.22 kg/m. If this is out of the aformentioned range listed, please consider follow up with your Primary Care Provider.   ________________________________________________________  The [facility] providers would like to encourage you to use MYCHART to communicate with providers for non-urgent requests or questions.  Due to long hold times on the telephone, sending your provider a message by [facility] may be a faster and more efficient way to get a response.  Please allow 48 business hours for a response.  Please remember that this is for non-urgent requests.  _______________________________________________________ It was a pleasure to see you today!  Thank you for trusting me with your gastrointestinal care!

## 2023-03-31 NOTE — Progress Notes (Signed)
 [facility] Progress Note  Chief Complaint: Dysphagia, esophageal stricture and long segment Barrett's esophagus  Subjective  Prior history Office consult with Carly Moore 12/25/2022 for dysphagia. Attempted endoscopy in [facility] 01/07/2023, but scope could not pass a tight proximal esophageal stricture. Scheduled for hospital-based outpatient endoscopy 01/16/2023, but procedure canceled and patient admitted to medical service for accelerated hypertension. Blood pressure controlled and Dr. Charlanne did an EGD 01/17/2024: 7 mm diameter stricture in the proximal esophagus (22 cm from incisors), treated with fluoroscopic and wire-guided balloon dilation to 10 mm, thus allowing scope passage for the remainder of the exam. Long segment Barrett's esophagus from 22 to 28 cm (biopsies below), and 3 cm hiatal hernia.  Patient advised to take omeprazole  20 mg twice daily for 8 weeks, then daily thereafter (open capsules and sprinkle in yogurt or applesauce) due to the dysphagia and stricture. She was contacted after discharge and offered repeat upper endoscope for further stricture dilation and reevaluation, and she wished to hold off for some time and follow-up in office today before a scheduled EGD on 04/29/2023 (See phone notes)    Discussed the use of AI scribe software for clinical note transcription with the patient, who gave verbal consent to proceed.  History of Present Illness   The patient, with a history of gastroesophageal reflux disease (GERD) leading to esophageal stricture, reports significant improvement in swallowing since the recent endoscopic dilation. They note the ability to swallow food without needing to wash it down with liquid, a marked change from their pre-procedure state. However, they express reluctance to swallow large capsules due to past difficulties, though she feels that some of this may just be apprehension because of how long she had had difficulty swallowing.  The patient  has been adhering to a twice-daily regimen of omeprazole , which they believe has contributed to their improved symptoms. They report no further episodes of choking since the procedure. However, they have experienced weight gain, which they attribute to the holiday season and improved ability to eat.  Prior to the procedure, the patient experienced regular heartburn, which they managed with home remedies such as baking soda. Since the procedure, they report no recurrence of heartburn symptoms.      ROS: Cardiovascular:  no chest pain Respiratory: no dyspnea  The patient's Past Medical, Family and Social History were reviewed and are on file in the EMR. Past Medical History:  Diagnosis Date   Diabetes mellitus without complication (HCC)    Phreesia 09/13/2019   Diabetes type 2, controlled (HCC)    Elevated cholesterol    Glaucoma    Hyperlipidemia    Hypertension    Phreesia 09/13/2019   Presbyopia    UTI (urinary tract infection)     Past Surgical History:  Procedure Laterality Date   BALLOON DILATION N/A 01/17/2023   Procedure: BALLOON DILATION;  Surgeon: Carly Groom, MD;  Location: [facility];  Service: Gastroenterology;  Laterality: N/A;   BIOPSY  01/17/2023   Procedure: BIOPSY;  Surgeon: Carly Groom, MD;  Location: [facility];  Service: Gastroenterology;;   ESOPHAGOGASTRODUODENOSCOPY (EGD) WITH PROPOFOL  N/A 01/17/2023   Procedure: ESOPHAGOGASTRODUODENOSCOPY (EGD) WITH PROPOFOL ;  Surgeon: Carly Groom, MD;  Location: [facility];  Service: Gastroenterology;  Laterality: N/A;   EYE SURGERY N/A    Phreesia 09/13/2019     Objective:  Med list reviewed  Current Outpatient Medications:    amLODipine  (NORVASC ) 10 MG tablet, TAKE 1 TABLET BY MOUTH EVERY DAY, Disp: 90 tablet, Rfl: 1  Ascorbic Acid (VITA-C PO), Take 1 Dose by mouth daily., Disp: , Rfl:    Berberine Chloride (BERBERINE HCI PO), Take 1 tablet by mouth daily., Disp: , Rfl:    Calcium -Magnesium-Vitamin  D (CALCIUM  MAGNESIUM PO), Take 1 tablet by mouth daily., Disp: , Rfl:    ELDERBERRY PO, Take 1 Dose by mouth daily., Disp: , Rfl:    JARDIANCE  10 MG TABS tablet, TAKE 1 TABLET BY MOUTH DAILY BEFORE BREAKFAST., Disp: 90 tablet, Rfl: 3   latanoprost  (XALATAN ) 0.005 % ophthalmic solution, Place 1 drop into both eyes at bedtime., Disp: , Rfl:    losartan  (COZAAR ) 100 MG tablet, TAKE 1 TABLET BY MOUTH EVERY DAY, Disp: 90 tablet, Rfl: 3   metFORMIN  (GLUCOPHAGE ) 1000 MG tablet, TAKE 1 TABLET BY MOUTH 2 (TWO) TIMES DAILY WITH A MEAL., Disp: 180 tablet, Rfl: 3   nebivolol  (BYSTOLIC ) 5 MG tablet, Take 1 tablet (5 mg total) by mouth daily., Disp: 90 tablet, Rfl: 3   OVER THE COUNTER MEDICATION, Take 1 tablet by mouth daily. Super beet, Disp: , Rfl:    timolol  (TIMOPTIC ) 0.5 % ophthalmic solution, Place 1 drop into both eyes daily., Disp: , Rfl:    Turmeric (QC TUMERIC COMPLEX PO), Take 1 tablet by mouth daily., Disp: , Rfl:    Zinc Citrate (ZINC GUMMY PO), Take 1-2 tablets by mouth daily., Disp: , Rfl:    omeprazole  (PRILOSEC) 20 MG capsule, Take 1 capsule (20 mg total) by mouth in the morning and at bedtime., Disp: 60 capsule, Rfl: 2   Vital signs in last 24 hrs: Vitals:   03/31/23 1307  BP: (!) 154/60  Pulse: 64   Wt Readings from Last 3 Encounters:  03/31/23 144 lb 8 oz (65.5 kg)  01/22/23 135 lb 2 oz (61.3 kg)  01/17/23 138 lb 14.2 oz (63 kg)    Physical Exam  Well-appearing, normal vocal quality HEENT: sclera anicteric, oral mucosa moist without lesions Neck: supple, no thyromegaly, JVD or lymphadenopathy Cardiac: Regular without appreciable murmur,  no peripheral edema Pulm: clear to auscultation bilaterally, normal RR and effort noted Abdomen: soft, no tenderness, with active bowel sounds. No guarding or palpable hepatosplenomegaly. Skin; warm and dry, no jaundice or rash   Labs:   ___________________________________________ Radiologic  studies:   ____________________________________________ Other: 01/17/2023 EGD biopsy results  SURGICAL PATHOLOGY CASE: [id number] PATIENT: Carly Moore Surgical Pathology Report     Clinical History: Esophageal stricture, esophageal dysphagia (crm)     FINAL MICROSCOPIC DIAGNOSIS:  A. ESOPHAGUS, BIOPSY: Intestinal metaplasia consistent with Barrett's esophagus. Negative for dysplasia.  B. ESOPHAGUS, 24-26, BIOPSY: Intestinal metaplasia consistent with Barrett's esophagus. Negative for dysplasia.  C. ESOPHAGUS, 22-24, BIOPSY: Intestinal metaplasia consistent with Barrett's esophagus. Negative for dysplasia.  D. ESOPHAGUS, 21-22, BIOPSY: Intestinal metaplasia consistent with Barrett's esophagus. Negative for dysplasia.   _____________________________________________   Encounter Diagnoses  Name Primary?   Esophageal dysphagia Yes   Esophageal stricture    Barrett's esophagus without dysplasia    Gastroesophageal reflux disease with esophagitis without hemorrhage     Assessment and Plan    Esophageal stricture Improved swallowing after endoscopic dilation. No further episodes of food impaction. Patient still has difficulty swallowing large capsules due to anxiety. -Continue Omeprazole  20mg  twice daily. -Consider second endoscopic dilation in March, pending patient's availability.  Gastroesophageal Reflux Disease (GERD) Chronic reflux likely contributed to esophageal stricture. Patient reports previous frequent heartburn, now improved post-dilation and on Omeprazole . -Reduce Omeprazole  to 20mg  once daily. -If heartburn recurs, patient to increase  Omeprazole  back to 20mg  twice daily and notify the office. -Renew Omeprazole  prescription.     Barrett's esophagus.  Precancerous condition requiring periodic endoscopic surveillance.  Based on current guidelines with long segment findings, recall EGD in 3 years.    22 minutes were spent on this encounter  (including chart review, history/exam, counseling/coordination of care, and documentation) > 50% of that time was spent on counseling and coordination of care.   Carly Moore

## 2023-05-30 ENCOUNTER — Telehealth: Payer: Self-pay

## 2023-05-30 NOTE — Telephone Encounter (Signed)
 Procedure:ENDO Procedure date: 06/09/23 Procedure location: [facility] Arrival Time: 615am Spoke with the patient Y/N: y Any prep concerns? n  Has the patient obtained the prep from the pharmacy ? n Do you have a care partner and transportation: y Any additional concerns? n

## 2023-06-02 ENCOUNTER — Encounter (HOSPITAL_COMMUNITY): Payer: Self-pay | Admitting: Gastroenterology

## 2023-06-02 NOTE — Progress Notes (Signed)
 Attempted to obtain medical history for pre op call via telephone, unable to reach at this time. HIPAA compliant voicemail message left requesting return call to pre surgical testing department.

## 2023-06-09 ENCOUNTER — Ambulatory Visit (HOSPITAL_COMMUNITY): Payer: Self-pay | Admitting: Anesthesiology

## 2023-06-09 ENCOUNTER — Other Ambulatory Visit: Payer: Self-pay

## 2023-06-09 ENCOUNTER — Encounter (HOSPITAL_COMMUNITY): Payer: Self-pay | Admitting: Gastroenterology

## 2023-06-09 ENCOUNTER — Encounter (HOSPITAL_COMMUNITY)
Admission: RE | Disposition: A | Discharge status: Home / Self Care | Payer: Self-pay | Source: Home / Self Care | Attending: Gastroenterology

## 2023-06-09 ENCOUNTER — Ambulatory Visit (HOSPITAL_COMMUNITY)
Admission: RE | Admit: 2023-06-09 | Discharge: 2023-06-09 | Disposition: A | Discharge status: Home / Self Care | Payer: Medicare HMO | Attending: Gastroenterology | Admitting: Gastroenterology

## 2023-06-09 DIAGNOSIS — I10 Essential (primary) hypertension: Secondary | ICD-10-CM | POA: Insufficient documentation

## 2023-06-09 DIAGNOSIS — R1319 Other dysphagia: Secondary | ICD-10-CM

## 2023-06-09 DIAGNOSIS — E119 Type 2 diabetes mellitus without complications: Secondary | ICD-10-CM | POA: Diagnosis not present

## 2023-06-09 DIAGNOSIS — E785 Hyperlipidemia, unspecified: Secondary | ICD-10-CM

## 2023-06-09 DIAGNOSIS — K449 Diaphragmatic hernia without obstruction or gangrene: Secondary | ICD-10-CM | POA: Diagnosis not present

## 2023-06-09 DIAGNOSIS — R1314 Dysphagia, pharyngoesophageal phase: Secondary | ICD-10-CM | POA: Diagnosis not present

## 2023-06-09 DIAGNOSIS — I129 Hypertensive chronic kidney disease with stage 1 through stage 4 chronic kidney disease, or unspecified chronic kidney disease: Secondary | ICD-10-CM | POA: Diagnosis not present

## 2023-06-09 DIAGNOSIS — Z7984 Long term (current) use of oral hypoglycemic drugs: Secondary | ICD-10-CM | POA: Diagnosis not present

## 2023-06-09 DIAGNOSIS — K2270 Barrett's esophagus without dysplasia: Secondary | ICD-10-CM | POA: Diagnosis not present

## 2023-06-09 DIAGNOSIS — K2289 Other specified disease of esophagus: Secondary | ICD-10-CM

## 2023-06-09 DIAGNOSIS — R1310 Dysphagia, unspecified: Secondary | ICD-10-CM

## 2023-06-09 DIAGNOSIS — K222 Esophageal obstruction: Secondary | ICD-10-CM

## 2023-06-09 DIAGNOSIS — N1831 Chronic kidney disease, stage 3a: Secondary | ICD-10-CM | POA: Diagnosis not present

## 2023-06-09 DIAGNOSIS — E1122 Type 2 diabetes mellitus with diabetic chronic kidney disease: Secondary | ICD-10-CM | POA: Diagnosis not present

## 2023-06-09 HISTORY — PX: ESOPHAGOGASTRODUODENOSCOPY (EGD) WITH PROPOFOL: SHX5813

## 2023-06-09 LAB — GLUCOSE, CAPILLARY: Glucose-Capillary: 144 mg/dL — ABNORMAL HIGH (ref 70–99)

## 2023-06-09 SURGERY — ESOPHAGOGASTRODUODENOSCOPY (EGD) WITH PROPOFOL
Anesthesia: Monitor Anesthesia Care

## 2023-06-09 MED ORDER — PHENYLEPHRINE HCL (PRESSORS) 10 MG/ML IV SOLN
INTRAVENOUS | Status: AC
  Filled 2023-06-09: qty 1

## 2023-06-09 MED ORDER — PROPOFOL 500 MG/50ML IV EMUL
INTRAVENOUS | Status: DC | PRN
  Administered 2023-06-09: 100 ug/kg/min via INTRAVENOUS

## 2023-06-09 MED ORDER — LIDOCAINE HCL (CARDIAC) PF 100 MG/5ML IV SOSY
PREFILLED_SYRINGE | INTRAVENOUS | Status: DC | PRN
  Administered 2023-06-09: 60 mg via INTRAVENOUS

## 2023-06-09 MED ORDER — SODIUM CHLORIDE 0.9 % IV SOLN: INTRAVENOUS | Status: DC

## 2023-06-09 MED ORDER — PROPOFOL 500 MG/50ML IV EMUL
INTRAVENOUS | Status: AC
  Filled 2023-06-09: qty 100

## 2023-06-09 MED ORDER — PROPOFOL 10 MG/ML IV BOLUS
INTRAVENOUS | Status: DC | PRN
  Administered 2023-06-09: 25 mg via INTRAVENOUS

## 2023-06-09 MED ORDER — PROPOFOL 1000 MG/100ML IV EMUL
INTRAVENOUS | Status: AC
  Filled 2023-06-09: qty 100

## 2023-06-09 SURGICAL SUPPLY — 14 items

## 2023-06-09 NOTE — H&P (Signed)
 History and Physical:  This patient presents for endoscopic testing for: Esophageal dysphagia and esophageal stricture  This is a 78 year old woman last seen in the office 03/31/2023 with clinical details of the condition outlined in that [facility] office note. She is here today for endoscopic evaluation and therapy of a reflux related proximal esophageal stricture requiring previous esophageal balloon dilation.  She has generally good symptom control on acid suppression medication and has intermittent solid food dysphagia.  Patient is otherwise without complaints or active issues today.   Past Medical History: Past Medical History:  Diagnosis Date   Diabetes mellitus without complication (HCC)    Phreesia 09/13/2019   Diabetes type 2, controlled (HCC)    Elevated cholesterol    Glaucoma    Hyperlipidemia    Hypertension    Phreesia 09/13/2019   Presbyopia    UTI (urinary tract infection)      Past Surgical History: Past Surgical History:  Procedure Laterality Date   BALLOON DILATION N/A 01/17/2023   Procedure: BALLOON DILATION;  Surgeon: Lynann Bologna, MD;  Location[facility];  Service: Gastroenterology;  Laterality: N/A;   BIOPSY  01/17/2023   Procedure: BIOPSY;  Surgeon: Lynann Bologna, MD;  Location[facility];  Service: Gastroenterology;;   ESOPHAGOGASTRODUODENOSCOPY (EGD) WITH PROPOFOL N/A 01/17/2023   Procedure: ESOPHAGOGASTRODUODENOSCOPY (EGD) WITH PROPOFOL;  Surgeon: Lynann Bologna, MD;  [facility];  Service: Gastroenterology;  Laterality: N/A;   EYE SURGERY N/A    Phreesia 09/13/2019    Allergies: Allergies  Allergen Reactions   Sulfa Antibiotics Rash    Outpatient Meds: Current Facility-Administered Medications  Medication Dose Route Frequency Provider Last Rate Last Admin   0.9 %  sodium chloride infusion   Intravenous Continuous Danis, Starr Lake III, MD           ___________________________________________________________________ Objective   Exam:  BP (!) 188/57   Pulse 64   Temp (!) 97.5 F (36.4 C) (Temporal)   Resp 15   Ht 5\' 1"  (1.549 m)   Wt 64.4 kg   SpO2 99%   BMI 26.83 kg/m   CV: regular , S1/S2 Resp: clear to auscultation bilaterally, normal RR and effort noted GI: soft, no tenderness, with active bowel sounds.   Assessment: Esophageal dysphagia Esophageal stricture   Plan:  EGD with balloon dilation of esophageal stricture  The benefits and risks of the planned procedure(s) were described in detail with the patient or (when appropriate) their health care proxy.  Risks were outlined as including, but not limited to, bleeding, infection, perforation, adverse medication reaction leading to cardiac or pulmonary decompensation, pancreatitis (if ERCP).  The limitation of incomplete mucosal visualization was also discussed.  No guarantees or warranties were given.  The patient is appropriate for an endoscopic procedure in the ambulatory setting.   - Amada Jupiter, MD

## 2023-06-09 NOTE — Discharge Instructions (Signed)

## 2023-06-09 NOTE — Interval H&P Note (Signed)
 History and Physical Interval Note:  06/09/2023 7:20 AM  Carly Moore  has presented today for surgery, with the diagnosis of Esophageal stricture.  The various methods of treatment have been discussed with the patient and family. After consideration of risks, benefits and other options for treatment, the patient has consented to  Procedure(s) with comments: ESOPHAGOGASTRODUODENOSCOPY (EGD) WITH PROPOFOL (N/A) - Fluoroscopy case as a surgical intervention.  The patient's history has been reviewed, patient examined, no change in status, stable for surgery.  I have reviewed the patient's chart and labs.  Questions were answered to the patient's satisfaction.     Charlie Pitter III

## 2023-06-09 NOTE — Transfer of Care (Signed)
 Immediate Anesthesia Transfer of Care Note  Patient: Carly Moore  Procedure(s) Performed: Procedure(s) with comments: ESOPHAGOGASTRODUODENOSCOPY (EGD) WITH PROPOFOL (N/A) - Fluoroscopy case  Patient Location: PACU  Anesthesia Type:MAC  Level of Consciousness:  sedated, patient cooperative and responds to stimulation  Airway & Oxygen Therapy:Patient Spontanous Breathing and Patient connected to face mask oxgen  Post-op Assessment:  Report given to PACU RN and Post -op Vital signs reviewed and stable  Post vital signs:  Reviewed and stable  Last Vitals:  Vitals:   06/09/23 0645 06/09/23 0751  BP: (!) 188/57 (!) 119/44  Pulse: 64 (!) 55  Resp: 15 19  Temp: (!) 36.4 C 36.4 C  SpO2: 99% 100%    Complications: No apparent anesthesia complications

## 2023-06-09 NOTE — Op Note (Signed)
 [facility] Patient Name: Carly Moore Procedure Date: 06/09/2023 MRN: [id number] Attending MD: Starr Lake. Myrtie Neither , MD, [id number] Date of Birth: 02-01-1946 CSN: [id number] Age: 78 Admit Type: Outpatient Procedure:                Upper GI endoscopy Indications:              Esophageal dysphagia, Follow-up of esophageal                            stricture                           clinical details in 03/31/23 office note                           also has nondysplastic long-segment BE discovered                            on Oct 2024 EGD when balloon dilation of tight                            stricture performed currently on once-daily PPI Providers:                Sherilyn Cooter L. Myrtie Neither, MD, Doristine Mango, RN, Rhodia Albright, Technician Referring MD:              Medicines:                Monitored Anesthesia Care Complications:            No immediate complications. Estimated Blood Loss:     Estimated blood loss: none. Procedure:                Pre-Anesthesia Assessment:                           - Prior to the procedure, a History and Physical                            was performed, and patient medications and                            allergies were reviewed. The patient's tolerance of                            previous anesthesia was also reviewed. The risks                            and benefits of the procedure and the sedation                            options and risks were discussed with the patient.                            All questions were answered, and  informed consent                            was obtained. Prior Anticoagulants: The patient has                            taken no anticoagulant or antiplatelet agents. ASA                            Grade Assessment: II - A patient with mild systemic                            disease. After reviewing the risks and benefits,                            the patient was deemed in  satisfactory condition to                            undergo the procedure.                           After obtaining informed consent, the endoscope was                            passed under direct vision. Throughout the                            procedure, the patient's blood pressure, pulse, and                            oxygen saturations were monitored continuously. The                            [id number]-H190 ([id number] Olympus endoscope was introduced                            through the mouth, and advanced to the second part                            of duodenum. The upper GI endoscopy was                            accomplished without difficulty. The patient                            tolerated the procedure well. Scope In: Scope Out: Findings:      The esophagus and gastroesophageal junction were examined with white       light. There were esophageal mucosal changes secondary to established       long-segment Barrett's disease. These changes involved the mucosa at the       upper extent of the gastric folds (28 cm from the incisors) extending to       the Z-line (22 cm from the incisors). Circumferential salmon-colored       mucosa was present. The maximum  longitudinal extent of these esophageal       mucosal changes was 6 cm in length. (extensively Bx in Oct 2024). No       raised or otherwise suspicious areas under [facility], magnification and NBI       examination.      There is no endoscopic evidence of stricture in the entire esophagus.      A 3 cm hiatal hernia was present.      The stomach was normal.      The cardia and gastric fundus were normal on retroflexion.      The examined duodenum was normal. Impression:               - Esophageal mucosal changes secondary to                            established long-segment Barrett's disease.                           - 3 cm hiatal hernia.                           - Normal stomach.                           - Normal examined  duodenum.                           - No specimens collected.                           The previously visualized and treated esophageal                            stricture has completely resolved after prior                            dilation and subsequent PPI therapy. Moderate Sedation:      MAC sedation used Recommendation:           - Patient has a contact number available for                            emergencies. The signs and symptoms of potential                            delayed complications were discussed with the                            patient. Return to normal activities tomorrow.                            Written discharge instructions were provided to the                            patient.                           - Resume previous diet.                           -  Continue present medications.                           - Clinic follow up in a year.                           Repeat EGD for Barrett's syrveillance as scheduled                            October 2027. Procedure Code(s):        --- Professional ---                           [phone], Esophagogastroduodenoscopy, flexible,                            transoral; diagnostic, including collection of                            specimen(s) by brushing or washing, when performed                            (separate procedure) Diagnosis Code(s):        --- Professional ---                           K22.70, Barrett's esophagus without dysplasia                           K44.9, Diaphragmatic hernia without obstruction or                            gangrene                           R13.14, Dysphagia, pharyngoesophageal phase                           K22.2, Esophageal obstruction CPT copyright 2022 American Medical Association. All rights reserved. The codes documented in this report are preliminary and upon coder review may  be revised to meet current compliance requirements. Declan Adamson L. Myrtie Neither, MD 06/09/2023  7:57:25 AM This report has been signed electronically. Number of Addenda: 0

## 2023-06-09 NOTE — Anesthesia Preprocedure Evaluation (Addendum)
 Anesthesia Evaluation  Patient identified by MRN, date of birth, ID band Patient awake    Reviewed: Allergy & Precautions, NPO status , Patient's Chart, lab work & pertinent test results  Airway Mallampati: II  TM Distance: >3 FB Neck ROM: Full    Dental no notable dental hx.    Pulmonary neg pulmonary ROS   Pulmonary exam normal        Cardiovascular hypertension, Pt. on medications and Pt. on home beta blockers Normal cardiovascular exam     Neuro/Psych negative neurological ROS  negative psych ROS   GI/Hepatic Neg liver ROS,,,  Endo/Other  diabetes, Oral Hypoglycemic Agents    Renal/GU Renal disease     Musculoskeletal negative musculoskeletal ROS (+)    Abdominal   Peds  Hematology negative hematology ROS (+)   Anesthesia Other Findings Esophageal stricture  Reproductive/Obstetrics                             Anesthesia Physical Anesthesia Plan  ASA: 3  Anesthesia Plan: MAC   Post-op Pain Management:    Induction:   PONV Risk Score and Plan: 2 and Propofol infusion and Treatment may vary due to age or medical condition  Airway Management Planned: Nasal Cannula  Additional Equipment:   Intra-op Plan:   Post-operative Plan:   Informed Consent: I have reviewed the patients History and Physical, chart, labs and discussed the procedure including the risks, benefits and alternatives for the proposed anesthesia with the patient or authorized representative who has indicated his/her understanding and acceptance.     Dental advisory given  Plan Discussed with: CRNA  Anesthesia Plan Comments:        Anesthesia Quick Evaluation

## 2023-06-10 NOTE — Anesthesia Postprocedure Evaluation (Signed)
 Anesthesia Post Note  Patient: Carly Moore  Procedure(s) Performed: ESOPHAGOGASTRODUODENOSCOPY (EGD) WITH PROPOFOL     Patient location during evaluation: Endoscopy Anesthesia Type: MAC Level of consciousness: awake Pain management: pain level controlled Vital Signs Assessment: post-procedure vital signs reviewed and stable Respiratory status: spontaneous breathing, nonlabored ventilation and respiratory function stable Cardiovascular status: blood pressure returned to baseline and stable Postop Assessment: no apparent nausea or vomiting Anesthetic complications: no   No notable events documented.  Last Vitals:  Vitals:   06/09/23 0813 06/09/23 0815  BP:  (!) 169/56  Pulse: (!) 59 (!) 58  Resp: 16 16  Temp:    SpO2: 99% 98%    Last Pain:  Vitals:   06/09/23 0815  TempSrc:   PainSc: 0-No pain                 Jacquees Gongora P Cody Oliger

## 2023-06-12 ENCOUNTER — Encounter (HOSPITAL_COMMUNITY): Payer: Self-pay | Admitting: Gastroenterology

## 2023-07-04 ENCOUNTER — Ambulatory Visit

## 2023-07-14 DIAGNOSIS — H401131 Primary open-angle glaucoma, bilateral, mild stage: Secondary | ICD-10-CM | POA: Diagnosis not present

## 2023-07-16 ENCOUNTER — Other Ambulatory Visit: Payer: Self-pay | Admitting: Gastroenterology

## 2023-07-25 ENCOUNTER — Ambulatory Visit (INDEPENDENT_AMBULATORY_CARE_PROVIDER_SITE_OTHER)

## 2023-07-25 VITALS — Ht 60.0 in | Wt 142.0 lb

## 2023-07-25 DIAGNOSIS — Z0000 Encounter for general adult medical examination without abnormal findings: Secondary | ICD-10-CM

## 2023-07-25 NOTE — Patient Instructions (Addendum)
 Carly Moore , Thank you for taking time to come for your Medicare Wellness Visit. I appreciate your ongoing commitment to your health goals. Please review the following plan we discussed and let me know if I can assist you in the future.   Referrals/Orders/Follow-Ups/Clinician Recommendations: It was nice talking with you today.  Remember to discuss a Bone Density screening with PCP during your next office visit.  Each day, aim for 6 glasses of water, plenty of protein in your diet and try to get up and walk/ stretch every hour for 5-10 minutes at a time.    This is a list of the screening recommended for you and due dates:  Health Maintenance  Topic Date Due   Hepatitis C Screening  Never done   Zoster (Shingles) Vaccine (1 of 2) Never done   DEXA scan (bone density measurement)  Never done   Mammogram  05/05/2014   Complete foot exam   04/11/2018   Eye exam for diabetics  02/09/2020   COVID-19 Vaccine (1 - 2024-25 season) Never done   Hemoglobin A1C  07/23/2023   DTaP/Tdap/Td vaccine (1 - Tdap) 01/22/2024*   Pneumonia Vaccine (1 of 2 - PCV) 01/22/2024*   Flu Shot  10/24/2023   Yearly kidney function blood test for diabetes  01/22/2024   Yearly kidney health urinalysis for diabetes  01/22/2024   Medicare Annual Wellness Visit  07/24/2024   HPV Vaccine  Aged Out   Meningitis B Vaccine  Aged Out  *Topic was postponed. The date shown is not the original due date.    Advanced directives: (Declined) Advance directive discussed with you today. Even though you declined this today, please call our office should you change your mind, and we can give you the proper paperwork for you to fill out.  Next Medicare Annual Wellness Visit scheduled for next year: Yes  Have you seen your provider in the last 6 months (3 months if uncontrolled diabetes)? Yes Appointment scheduled.

## 2023-07-25 NOTE — Progress Notes (Signed)
 Subjective:   Carly Moore is a 78 y.o. who presents for a Medicare Wellness preventive visit.  Visit Complete: Virtual I connected with  Jeffrie Minion Fregeau on 07/25/23 by a audio enabled telemedicine application and verified that I am speaking with the correct person using two identifiers.  Patient Location: Home  Provider Location: Office/Clinic  I discussed the limitations of evaluation and management by telemedicine. The patient expressed understanding and agreed to proceed.  Vital Signs: Because this visit was a virtual/telehealth visit, some criteria may be missing or patient reported. Any vitals not documented were not able to be obtained and vitals that have been documented are patient reported.  VideoDeclined- This patient declined Librarian, academic. Therefore the visit was completed with audio only.  Persons Participating in Visit: Patient.  AWV Questionnaire: Yes: Patient Medicare AWV questionnaire was completed by the patient on 07/03/2023; I have confirmed that all information answered by patient is correct and no changes since this date.  Cardiac Risk Factors include: advanced age (>42men, >30 women);hypertension;diabetes mellitus;dyslipidemia;Other (see comment), Risk factor comments: Stage 3 CKD     Objective:    Today's Vitals   07/25/23 0908  Weight: 142 lb (64.4 kg)  Height: 5' (1.524 m)   Body mass index is 27.73 kg/m.     07/25/2023    9:37 AM 06/09/2023    6:39 AM 01/17/2023   12:09 PM 01/16/2023    4:07 PM 01/16/2023    9:26 AM 01/16/2023    8:14 AM 01/23/2022   10:59 AM  Advanced Directives  Does Patient Have a Medical Advance Directive? No Yes No No No No Yes  Type of Furniture conservator/restorer;Living will     Healthcare Power of [place];Living will  Does patient want to make changes to medical advance directive?       No - Patient declined  Copy of Healthcare Power of Attorney in Chart?   Yes - validated most recent copy scanned in chart (See row information)     No - copy requested  Would patient like information on creating a medical advance directive?    No - Patient declined No - Patient declined No - Patient declined     Current Medications (verified) Outpatient Encounter Medications as of 07/25/2023  Medication Sig   amLODipine  (NORVASC ) 10 MG tablet TAKE 1 TABLET BY MOUTH EVERY DAY   Ascorbic Acid (VITA-C PO) Take 1 Dose by mouth daily.   Berberine Chloride (BERBERINE HCI PO) Take 1 tablet by mouth daily.   Calcium -Magnesium-Vitamin D  (CALCIUM  MAGNESIUM PO) Take 1 tablet by mouth daily.   ELDERBERRY PO Take 1 Dose by mouth daily.   JARDIANCE  10 MG TABS tablet TAKE 1 TABLET BY MOUTH DAILY BEFORE BREAKFAST.   latanoprost  (XALATAN ) 0.005 % ophthalmic solution Place 1 drop into both eyes at bedtime.   losartan  (COZAAR ) 100 MG tablet TAKE 1 TABLET BY MOUTH EVERY DAY   metFORMIN  (GLUCOPHAGE ) 1000 MG tablet TAKE 1 TABLET BY MOUTH 2 (TWO) TIMES DAILY WITH A MEAL.   nebivolol  (BYSTOLIC ) 5 MG tablet Take 1 tablet (5 mg total) by mouth daily.   omeprazole  (PRILOSEC) 20 MG capsule TAKE 1 CAPSULE (20 MG TOTAL) BY MOUTH IN THE MORNING AND AT BEDTIME.   OVER THE COUNTER MEDICATION Take 1 tablet by mouth daily. Super beet   timolol  (TIMOPTIC ) 0.5 % ophthalmic solution Place 1 drop into both eyes daily.   Turmeric (QC TUMERIC COMPLEX PO) Take 1  tablet by mouth daily.   Zinc Citrate (ZINC GUMMY PO) Take 1-2 tablets by mouth daily.   No facility-administered encounter medications on file as of 07/25/2023.    Allergies (verified) Sulfa antibiotics   History: Past Medical History:  Diagnosis Date   Diabetes mellitus without complication (HCC)    [facility]reesia 09/13/2019   Diabetes type 2, controlled (HCC)    Elevated cholesterol    Glaucoma    Hyperlipidemia    Hypertension    [employer]rees[employer] 09/13/2019   Presbyopia    UTI (urinary tract infection)    Past Surgical History:  Procedure  Laterality Date   BALLOON DILATION N/A 01/17/2023   Procedure: BALLOON DILATION;  Surgeon: Lajuan Pila, MD;  Location: [facility];  Service: Gastroenterology;  Laterality: N/A;   BIOPSY  01/17/2023   Procedure: BIOPSY;  Surgeon: Lajuan Pila, MD;  Location: [facility];  Service: Gastroenterology;;   ESOPHAGOGASTRODUODENOSCOPY (EGD) WITH PROPOFOL  N/A 01/17/2023   Procedure: ESOPHAGOGASTRODUODENOSCOPY (EGD) WITH PROPOFOL ;  Surgeon: Lajuan Pila, MD;  Location: [facility];  Service: Gastroenterology;  Laterality: N/A;   ESOPHAGOGASTRODUODENOSCOPY (EGD) WITH PROPOFOL  N/A 06/09/2023   Procedure: ESOPHAGOGASTRODUODENOSCOPY (EGD) WITH PROPOFOL ;  Surgeon: Albertina Hugger, MD;  Location: [facility];  Service: Gastroenterology;  Laterality: N/A;  Fluoroscopy case   EYE SURGERY N/A    [facility]reesia 09/13/2019   Family History  Problem Relation Age of Onset   Heart disease Mother    Cancer Father    Diabetes Father    Alcohol abuse Brother    Colon cancer Neg Hx    Stomach cancer Neg Hx    Rectal cancer Neg Hx    Esophageal cancer Neg Hx    Social History   Socioeconomic History   Marital status: Single    Spouse name: Not on file   Number of children: 1   Years of education: Not on file   Highest education level: Some college, no degree  Occupational History   Occupation: Engineer, agricultural   Occupation: retired  Tobacco Use   Smoking status: Never   Smokeless tobacco: Never  Vaping Use   Vaping status: Never Used  Substance and Sexual Activity   Alcohol use: Not Currently   Drug use: No   Sexual activity: Not Currently  Other Topics Concern   Not on file  Social History Narrative   Single   1 son   Lives alone/2025   Social Drivers of Health   Financial Resource Strain: Low Risk  (07/03/2023)   Overall Financial Resource Strain (CARDIA)    Difficulty of Paying Living Expenses: Not hard at all  Food Insecurity: No Food Insecurity (07/03/2023)   Hunger Vital Sign     Worried About Running Out of Food in the Last Year: Never true    Ran Out of Food in the Last Year: Never true  Transportation Needs: No Transportation Needs (07/03/2023)   PRAPARE - Administrator, Civil Service (Medical): No    Lack of Transportation (Non-Medical): No  Physical Activity: Insufficiently Active (07/03/2023)   Exercise Vital Sign    Days of Exercise per Week: 3 days    Minutes of Exercise per Session: 20 min  Stress: No Stress Concern Present (07/03/2023)   [employer] of Occupational Health - Occupational Stress Questionnaire    Feeling of Stress : Not at all  Social Connections: Unknown (07/03/2023)   Social Connection and Isolation Panel [NHANES]    Frequency of Communication with Friends and Family: More than three  times a week    Frequency of Social Gatherings with Friends and Family: Once a week    Attends Religious Services: Patient declined    Database administrator or Organizations: No    Attends Engineer, structural: Not on file    Marital Status: Never married    Tobacco Counseling Counseling given: Not Answered    Clinical Intake:  Pre-visit preparation completed: Yes  Pain : No/denies pain     BMI - recorded: 27.73 Nutritional Status: BMI 25 -29 Overweight Nutritional Risks: None Diabetes: Yes CBG done?: No Did pt. bring in CBG monitor from home?: No  Lab Results  Component Value Date   HGBA1C 8.0 (A) 01/22/2023   HGBA1C 8.0 (A) 09/24/2022   HGBA1C 10.1 (A) 01/15/2022     How often do you need to have someone help you when you read instructions, pamphlets, or other written materials from your doctor or pharmacy?: 1 - Never  Interpreter Needed?: No  Information entered by :: Tyeson Tanimoto, RMA   Activities of Daily Living     07/25/2023    9:10 AM 01/16/2023    4:07 PM  In your present state of health, do you have any difficulty performing the following activities:  Hearing? 0 1  Vision? 0 0  Difficulty  concentrating or making decisions? 0 0  Walking or climbing stairs? 0   Dressing or bathing? 0   Doing errands, shopping? 0 0  Preparing Food and eating ? N   Using the Toilet? N   In the past six months, have you accidently leaked urine? N   Do you have problems with loss of bowel control? N   Managing your Medications? N   Managing your Finances? N   Housekeeping or managing your Housekeeping? N     Patient Care Team: Elvira Hammersmith, MD as PCP - General (Internal Medicine) [place] Scot Cutter, MD as Consulting Physician (Ophthalmology)  Indicate any recent Medical Services you may have received from other than [facility] providers in the past year (date may be approximate).     Assessment:   This is a routine wellness examination for [place].  Hearing/Vision screen Hearing Screening - Comments:: Denies hearing difficulties   Vision Screening - Comments:: Wears eyeglasses for driving/ Dr. Starlet Eaves   Goals Addressed               This Visit's Progress     Patient Stated (pt-stated)        Would like to come off some of her prescription medications./2025       Depression Screen     07/25/2023    9:42 AM 01/22/2023    2:11 PM 09/24/2022    9:42 AM 01/23/2022   11:15 AM 01/15/2022   10:07 AM 02/07/2021   10:21 AM 11/09/2019    1:52 PM  PHQ 2/9 Scores  PHQ - 2 Score 0 0 0 0 0 0 0  PHQ- 9 Score 2          Fall Risk     07/25/2023    9:38 AM 01/22/2023    2:11 PM 09/24/2022    9:42 AM 01/23/2022   11:00 AM 01/15/2022   10:07 AM  Fall Risk   Falls in the past year? 0 0 0 0 0  Number falls in past yr: 0 0 0 0 0  Injury with Fall? 0 0 0 0 0  Risk for fall due to : Medication side effect No Fall  Risks No Fall Risks No Fall Risks No Fall Risks  Follow up Falls prevention discussed;Falls evaluation completed Falls evaluation completed Falls evaluation completed Falls evaluation completed Falls evaluation completed    MEDICARE RISK AT HOME:  Medicare Risk at  Home Any stairs in or around the home?: Yes (live in split level house) If so, are there any without handrails?: Yes Home free of loose throw rugs in walkways, pet beds, electrical cords, etc?: Yes Adequate lighting in your home to reduce risk of falls?: Yes Life alert?: No Use of a cane, walker or w/c?: No Grab bars in the bathroom?: No Shower chair or bench in shower?: No Elevated toilet seat or a handicapped toilet?: No  TIMED UP AND GO:  Was the test performed?  No  Cognitive Function: 6CIT completed        07/25/2023    9:40 AM 01/23/2022   11:00 AM 02/22/2019   11:09 AM  6CIT Screen  What Year? 0 points 0 points 0 points  What month? 0 points 0 points 0 points  What time? 0 points 0 points 0 points  Count back from 20 0 points 0 points 0 points  Months in reverse 0 points 0 points 0 points  Repeat phrase 0 points 0 points 0 points  Total Score 0 points 0 points 0 points    Immunizations Immunization History  Administered Date(s) Administered   Influenza Split 12/25/2011   Influenza,inj,Quad PF,6+ Mos 04/28/2013, 02/03/2014, 03/07/2015    Screening Tests Health Maintenance  Topic Date Due   Hepatitis C Screening  Never done   Zoster Vaccines- Shingrix (1 of 2) Never done   DEXA SCAN  Never done   MAMMOGRAM  05/05/2014   FOOT EXAM  04/11/2018   OPHTHALMOLOGY EXAM  02/09/2020   COVID-19 Vaccine (1 - 2024-25 season) Never done   HEMOGLOBIN A1C  07/23/2023   DTaP/Tdap/Td (1 - Tdap) 01/22/2024 (Originally 10/17/1964)   Pneumonia Vaccine 61+ Years old (1 of 2 - PCV) 01/22/2024 (Originally 10/17/1964)   INFLUENZA VACCINE  10/24/2023   Diabetic kidney evaluation - eGFR measurement  01/22/2024   Diabetic kidney evaluation - Urine ACR  01/22/2024   Medicare Annual Wellness (AWV)  07/24/2024   HPV VACCINES  Aged Out   Meningococcal B Vaccine  Aged Out    Health Maintenance  Health Maintenance Due  Topic Date Due   Hepatitis C Screening  Never done   Zoster  Vaccines- Shingrix (1 of 2) Never done   DEXA SCAN  Never done   MAMMOGRAM  05/05/2014   FOOT EXAM  04/11/2018   OPHTHALMOLOGY EXAM  02/09/2020   COVID-19 Vaccine (1 - 2024-25 season) Never done   HEMOGLOBIN A1C  07/23/2023   Health Maintenance Items Addressed: See Nurse Notes  Additional Screening:  Vision Screening: Recommended annual ophthalmology exams for early detection of glaucoma and other disorders of the eye.  Dental Screening: Recommended annual dental exams for proper oral hygiene  Community Resource Referral / Chronic Care Management: CRR required this visit?  No   CCM required this visit?  No     Plan:     I have personally reviewed and noted the following in the patient's chart:   Medical and social history Use of alcohol, tobacco or illicit drugs  Current medications and supplements including opioid prescriptions. Patient is not currently taking opioid prescriptions. Functional ability and status Nutritional status Physical activity Advanced directives List of other physicians Hospitalizations, surgeries, and ER visits in previous  12 months Vitals Screenings to include cognitive, depression, and falls Referrals and appointments  In addition, I have reviewed and discussed with patient certain preventive protocols, quality metrics, and best practice recommendations. A written personalized care plan for preventive services as well as general preventive health recommendations were provided to patient.     Cheri Ayotte L Tyric Rodeheaver, CMA   07/25/2023   After Visit Summary: (MyChart) Due to this being a telephonic visit, the after visit summary with patients personalized plan was offered to patient via MyChart   Notes: Please refer to Routing Comments.

## 2023-07-29 ENCOUNTER — Ambulatory Visit (INDEPENDENT_AMBULATORY_CARE_PROVIDER_SITE_OTHER): Admitting: Emergency Medicine

## 2023-07-29 ENCOUNTER — Encounter: Payer: Self-pay | Admitting: Emergency Medicine

## 2023-07-29 VITALS — BP 158/62 | HR 62 | Temp 98.5°F | Ht 60.0 in | Wt 144.0 lb

## 2023-07-29 DIAGNOSIS — Z7984 Long term (current) use of oral hypoglycemic drugs: Secondary | ICD-10-CM

## 2023-07-29 DIAGNOSIS — E785 Hyperlipidemia, unspecified: Secondary | ICD-10-CM | POA: Diagnosis not present

## 2023-07-29 DIAGNOSIS — E1169 Type 2 diabetes mellitus with other specified complication: Secondary | ICD-10-CM | POA: Diagnosis not present

## 2023-07-29 DIAGNOSIS — K222 Esophageal obstruction: Secondary | ICD-10-CM

## 2023-07-29 DIAGNOSIS — E1159 Type 2 diabetes mellitus with other circulatory complications: Secondary | ICD-10-CM | POA: Diagnosis not present

## 2023-07-29 DIAGNOSIS — N1831 Chronic kidney disease, stage 3a: Secondary | ICD-10-CM

## 2023-07-29 DIAGNOSIS — I152 Hypertension secondary to endocrine disorders: Secondary | ICD-10-CM | POA: Diagnosis not present

## 2023-07-29 LAB — POCT GLYCOSYLATED HEMOGLOBIN (HGB A1C): Hemoglobin A1C: 7.2 % — AB (ref 4.0–5.6)

## 2023-07-29 MED ORDER — LOSARTAN POTASSIUM-HCTZ 100-12.5 MG PO TABS: 1.0000 | ORAL_TABLET | Freq: Every day | ORAL | 3 refills | Status: AC

## 2023-07-29 NOTE — Patient Instructions (Signed)
 Hypertension, Adult High blood pressure (hypertension) is when the force of blood pumping through the arteries is too strong. The arteries are the blood vessels that carry blood from the heart throughout the body. Hypertension forces the heart to work harder to pump blood and may cause arteries to become narrow or stiff. Untreated or uncontrolled hypertension can lead to a heart attack, heart failure, a stroke, kidney disease, and other problems. A blood pressure reading consists of a higher number over a lower number. Ideally, your blood pressure should be below 120/80. The first ("top") number is called the systolic pressure. It is a measure of the pressure in your arteries as your heart beats. The second ("bottom") number is called the diastolic pressure. It is a measure of the pressure in your arteries as the heart relaxes. What are the causes? The exact cause of this condition is not known. There are some conditions that result in high blood pressure. What increases the risk? Certain factors may make you more likely to develop high blood pressure. Some of these risk factors are under your control, including: Smoking. Not getting enough exercise or physical activity. Being overweight. Having too much fat, sugar, calories, or salt (sodium) in your diet. Drinking too much alcohol. Other risk factors include: Having a personal history of heart disease, diabetes, high cholesterol, or kidney disease. Stress. Having a family history of high blood pressure and high cholesterol. Having obstructive sleep apnea. Age. The risk increases with age. What are the signs or symptoms? High blood pressure may not cause symptoms. Very high blood pressure (hypertensive crisis) may cause: Headache. Fast or irregular heartbeats (palpitations). Shortness of breath. Nosebleed. Nausea and vomiting. Vision changes. Severe chest pain, dizziness, and seizures. How is this diagnosed? This condition is diagnosed by  measuring your blood pressure while you are seated, with your arm resting on a flat surface, your legs uncrossed, and your feet flat on the floor. The cuff of the blood pressure monitor will be placed directly against the skin of your upper arm at the level of your heart. Blood pressure should be measured at least twice using the same arm. Certain conditions can cause a difference in blood pressure between your right and left arms. If you have a high blood pressure reading during one visit or you have normal blood pressure with other risk factors, you may be asked to: Return on a different day to have your blood pressure checked again. Monitor your blood pressure at home for 1 week or longer. If you are diagnosed with hypertension, you may have other blood or imaging tests to help your health care provider understand your overall risk for other conditions. How is this treated? This condition is treated by making healthy lifestyle changes, such as eating healthy foods, exercising more, and reducing your alcohol intake. You may be referred for counseling on a healthy diet and physical activity. Your health care provider may prescribe medicine if lifestyle changes are not enough to get your blood pressure under control and if: Your systolic blood pressure is above 130. Your diastolic blood pressure is above 80. Your personal target blood pressure may vary depending on your medical conditions, your age, and other factors. Follow these instructions at home: Eating and drinking  Eat a diet that is high in fiber and potassium, and low in sodium, added sugar, and fat. An example of this eating plan is called the DASH diet. DASH stands for Dietary Approaches to Stop Hypertension. To eat this way: Eat  plenty of fresh fruits and vegetables. Try to fill one half of your plate at each meal with fruits and vegetables. Eat whole grains, such as whole-wheat pasta, brown rice, or whole-grain bread. Fill about one  fourth of your plate with whole grains. Eat or drink low-fat dairy products, such as skim milk or low-fat yogurt. Avoid fatty cuts of meat, processed or cured meats, and poultry with skin. Fill about one fourth of your plate with lean proteins, such as fish, chicken without skin, beans, eggs, or tofu. Avoid pre-made and processed foods. These tend to be higher in sodium, added sugar, and fat. Reduce your daily sodium intake. Many people with hypertension should eat less than 1,500 mg of sodium a day. Do not drink alcohol if: Your health care provider tells you not to drink. You are pregnant, may be pregnant, or are planning to become pregnant. If you drink alcohol: Limit how much you have to: 0-1 drink a day for women. 0-2 drinks a day for men. Know how much alcohol is in your drink. In the [place] one drink equals one 12 oz bottle of beer (355 mL), one 5 oz glass of wine (148 mL), or one 1 oz glass of hard liquor (44 mL). Lifestyle  Work with your health care provider to maintain a healthy body weight or to lose weight. Ask what an ideal weight is for you. Get at least 30 minutes of exercise that causes your heart to beat faster (aerobic exercise) most days of the week. Activities may include walking, swimming, or biking. Include exercise to strengthen your muscles (resistance exercise), such as Pilates or lifting weights, as part of your weekly exercise routine. Try to do these types of exercises for 30 minutes at least 3 days a week. Do not use any products that contain nicotine or tobacco. These products include cigarettes, chewing tobacco, and vaping devices, such as e-cigarettes. If you need help quitting, ask your health care provider. Monitor your blood pressure at home as told by your health care provider. Keep all follow-up visits. This is important. Medicines Take over-the-counter and prescription medicines only as told by your health care provider. Follow directions carefully. Blood  pressure medicines must be taken as prescribed. Do not skip doses of blood pressure medicine. Doing this puts you at risk for problems and can make the medicine less effective. Ask your health care provider about side effects or reactions to medicines that you should watch for. Contact a health care provider if you: Think you are having a reaction to a medicine you are taking. Have headaches that keep coming back (recurring). Feel dizzy. Have swelling in your ankles. Have trouble with your vision. Get help right away if you: Develop a severe headache or confusion. Have unusual weakness or numbness. Feel faint. Have severe pain in your chest or abdomen. Vomit repeatedly. Have trouble breathing. These symptoms may be an emergency. Get help right away. Call 911. Do not wait to see if the symptoms will go away. Do not drive yourself to the hospital. Summary Hypertension is when the force of blood pumping through your arteries is too strong. If this condition is not controlled, it may put you at risk for serious complications. Your personal target blood pressure may vary depending on your medical conditions, your age, and other factors. For most people, a normal blood pressure is less than 120/80. Hypertension is treated with lifestyle changes, medicines, or a combination of both. Lifestyle changes include losing weight, eating a healthy,  low-sodium diet, exercising more, and limiting alcohol. This information is not intended to replace advice given to you by your health care provider. Make sure you discuss any questions you have with your health care provider. Document Revised: 01/16/2021 Document Reviewed: 01/16/2021 Elsevier Patient Education  2024 [employer].

## 2023-07-29 NOTE — Assessment & Plan Note (Signed)
 Much improved after the esophageal dilatation procedure Eating better with significant improvement swallowing

## 2023-07-29 NOTE — Progress Notes (Signed)
 Carly Moore 78 y.o.   Chief Complaint  Patient presents with   Follow-up    6 month f/u for HTN/ DM. Patient states her bp is still high 190/92 and has been high since her surgery in October. She is still taking her bp medications     HISTORY OF PRESENT ILLNESS: This is a 78 y.o. female here for 57-month follow-up of hypertension and diabetes Blood pressure readings still high at home No other complaints or medical concerns today.  HPI   Prior to Admission medications   Medication Sig Start Date End Date Taking? Authorizing Provider  amLODipine  (NORVASC ) 10 MG tablet TAKE 1 TABLET BY MOUTH EVERY DAY 03/09/23  Yes Leonda Cristo, Isidro Margo, MD  Berberine Chloride (BERBERINE HCI PO) Take 1 tablet by mouth daily.   Yes [provider]  Calcium -Magnesium-Vitamin D  (CALCIUM  MAGNESIUM PO) Take 1 tablet by mouth daily.   Yes [provider]  ELDERBERRY PO Take 1 Dose by mouth daily.   Yes [provider]  latanoprost  (XALATAN ) 0.005 % ophthalmic solution Place 1 drop into both eyes at bedtime. 02/18/15  Yes [provider]  losartan  (COZAAR ) 100 MG tablet TAKE 1 TABLET BY MOUTH EVERY DAY 10/21/22  Yes Chan Sheahan, Isidro Margo, MD  metFORMIN  (GLUCOPHAGE ) 1000 MG tablet TAKE 1 TABLET BY MOUTH 2 (TWO) TIMES DAILY WITH A MEAL. 10/21/22  Yes Edilia Ghuman, Isidro Margo, MD  nebivolol  (BYSTOLIC ) 5 MG tablet Take 1 tablet (5 mg total) by mouth daily. 01/22/23  Yes Khira Cudmore, Isidro Margo, MD  omeprazole  (PRILOSEC) 20 MG capsule TAKE 1 CAPSULE (20 MG TOTAL) BY MOUTH IN THE MORNING AND AT BEDTIME. 07/16/23 08/15/23 Yes Danis, Cordelia Dessert, MD  OVER THE COUNTER MEDICATION Take 1 tablet by mouth daily. Super beet   Yes [provider]  timolol  (TIMOPTIC ) 0.5 % ophthalmic solution Place 1 drop into both eyes daily. 08/14/22  Yes [provider]  Turmeric (QC TUMERIC COMPLEX PO) Take 1 tablet by mouth daily.   Yes [provider]  Zinc Citrate (ZINC GUMMY  PO) Take 1-2 tablets by mouth daily.   Yes [provider]  Ascorbic Acid (VITA-C PO) Take 1 Dose by mouth daily.    [provider]  JARDIANCE  10 MG TABS tablet TAKE 1 TABLET BY MOUTH DAILY BEFORE BREAKFAST. 10/21/22   Elvira Hammersmith, MD    Allergies  Allergen Reactions   Sulfa Antibiotics Rash    Patient Active Problem List   Diagnosis Date Noted   Esophageal dysphagia 06/09/2023   Esophageal stricture 01/18/2023   Barrett esophagus 01/18/2023   Dyslipidemia associated with type 2 diabetes mellitus (HCC) 01/15/2022   Stage 3a chronic kidney disease (HCC) 01/15/2022   History of glaucoma 11/09/2019   Vitamin D  deficiency 06/11/2019   Noncompliance with medications 06/11/2019   Glaucoma    Hypertension associated with diabetes (HCC)    Diabetes type 2, uncontrolled    Dyslipidemia     Past Medical History:  Diagnosis Date   Diabetes mellitus without complication (HCC)    [employer] 09/13/2019   Diabetes type 2, controlled (HCC)    Elevated cholesterol    Glaucoma    Hyperlipidemia    Hypertension    [employer] 09/13/2019   Presbyopia    UTI (urinary tract infection)     Past Surgical History:  Procedure Laterality Date   BALLOON DILATION N/A 01/17/2023   Procedure: BALLOON DILATION;  Surgeon: Lajuan Pila, MD;  Location: [facility];  Service: Gastroenterology;  Laterality: N/A;   BIOPSY  01/17/2023   Procedure: BIOPSY;  Surgeon: Lajuan Pila, MD;  Location: [facility];  Service: Gastroenterology;;   ESOPHAGOGASTRODUODENOSCOPY (EGD) WITH PROPOFOL  N/A 01/17/2023   Procedure: ESOPHAGOGASTRODUODENOSCOPY (EGD) WITH PROPOFOL ;  Surgeon: Lajuan Pila, MD;  Location: [facility];  Service: Gastroenterology;  Laterality: N/A;   ESOPHAGOGASTRODUODENOSCOPY (EGD) WITH PROPOFOL  N/A 06/09/2023   Procedure: ESOPHAGOGASTRODUODENOSCOPY (EGD) WITH PROPOFOL ;  Surgeon: Albertina Hugger, MD;  Location: [facility];  Service: Gastroenterology;  Laterality: N/A;   Fluoroscopy case   EYE SURGERY N/A    [employer] 09/13/2019    Social History   Socioeconomic History   Marital status: Single    Spouse name: Not on file   Number of children: 1   Years of education: Not on file   Highest education level: Some college, no degree  Occupational History   Occupation: Engineer, agricultural   Occupation: retired  Tobacco Use   Smoking status: Never   Smokeless tobacco: Never  Vaping Use   Vaping status: Never Used  Substance and Sexual Activity   Alcohol use: Not Currently   Drug use: No   Sexual activity: Not Currently  Other Topics Concern   Not on file  Social History Narrative   Single   1 son   Lives alone/2025   Social Drivers of Health   Financial Resource Strain: Low Risk  (07/03/2023)   Overall Financial Resource Strain (CARDIA)    Difficulty of Paying Living Expenses: Not hard at all  Food Insecurity: No Food Insecurity (07/03/2023)   Hunger Vital Sign    Worried About Running Out of Food in the Last Year: Never true    Ran Out of Food in the Last Year: Never true  Transportation Needs: No Transportation Needs (07/03/2023)   PRAPARE - Administrator, Civil Service (Medical): No    Lack of Transportation (Non-Medical): No  Physical Activity: Insufficiently Active (07/03/2023)   Exercise Vital Sign    Days of Exercise per Week: 3 days    Minutes of Exercise per Session: 20 min  Stress: No Stress Concern Present (07/03/2023)   [employer] of Occupational Health - Occupational Stress Questionnaire    Feeling of Stress : Not at all  Social Connections: Unknown (07/03/2023)   Social Connection and Isolation Panel [NHANES]    Frequency of Communication with Friends and Family: More than three times a week    Frequency of Social Gatherings with Friends and Family: Once a week    Attends Religious Services: Patient declined    Database administrator or Organizations: No    Attends Engineer, structural: Not on file     Marital Status: Never married  Intimate Partner Violence: Patient Unable To Answer (07/25/2023)   Humiliation, Afraid, Rape, and Kick questionnaire    Fear of Current or Ex-Partner: Patient unable to answer    Emotionally Abused: Patient unable to answer    Physically Abused: Patient unable to answer    Sexually Abused: Patient unable to answer    Family History  Problem Relation Age of Onset   Heart disease Mother    Cancer Father    Diabetes Father    Alcohol abuse Brother    Colon cancer Neg Hx    Stomach cancer Neg Hx    Rectal cancer Neg Hx    Esophageal cancer Neg Hx      Review of Systems  Constitutional: Negative.  Negative for chills and fever.  HENT:  Negative.  Negative for congestion and sore throat.   Respiratory: Negative.  Negative for cough and shortness of breath.   Cardiovascular: Negative.  Negative for chest pain and palpitations.  Gastrointestinal:  Negative for abdominal pain, diarrhea, nausea and vomiting.  Genitourinary: Negative.  Negative for dysuria and hematuria.  Skin: Negative.  Negative for rash.  Neurological: Negative.  Negative for dizziness and headaches.  All other systems reviewed and are negative.   Vitals:   07/29/23 1045  BP: (!) 158/62  Pulse: 62  Temp: 98.5 F (36.9 C)  SpO2: 96%    Physical Exam Vitals reviewed.  Constitutional:      Appearance: Normal appearance.  HENT:     Head: Normocephalic.     Mouth/Throat:     Mouth: Mucous membranes are moist.     Pharynx: Oropharynx is clear.  Eyes:     Extraocular Movements: Extraocular movements intact.     Pupils: Pupils are equal, round, and reactive to light.  Cardiovascular:     Rate and Rhythm: Normal rate and regular rhythm.     Pulses: Normal pulses.     Heart sounds: Normal heart sounds.  Pulmonary:     Effort: Pulmonary effort is normal.     Breath sounds: Normal breath sounds.  Skin:    General: Skin is warm and dry.     Capillary Refill: Capillary refill  takes less than 2 seconds.  Neurological:     General: No focal deficit present.     Mental Status: She is alert and oriented to person, place, and time.  Psychiatric:        Mood and Affect: Mood normal.        Behavior: Behavior normal.     Results for orders placed or performed in visit on 07/29/23 (from the past 24 hours)  POCT HgB A1C     Status: Abnormal   Collection Time: 07/29/23 11:08 AM  Result Value Ref Range   Hemoglobin A1C 7.2 (A) 4.0 - 5.6 %   HbA1c POC (<> result, manual entry)     HbA1c, POC (prediabetic range)     HbA1c, POC (controlled diabetic range)      ASSESSMENT & PLAN: A total of 42 minutes was spent with the patient and counseling/coordination of care regarding preparing for this visit, review of most recent office visit notes, review of multiple chronic medical conditions and their management, cardiovascular risks associated with uncontrolled hypertension, review of all medications and changes made, review of most recent bloodwork results including interpretation of today's hemoglobin A1c, review of health maintenance items, education on nutrition, prognosis, documentation, and need for follow up. .  Problem List Items Addressed This Visit       Cardiovascular and Mediastinum   Hypertension associated with diabetes (HCC) - Primary   BP Readings from Last 3 Encounters:  07/29/23 (!) 158/62  06/09/23 (!) 169/56  03/31/23 (!) 154/60  Blood pressure still not optimally controlled Recommend to continue amlodipine  10 mg daily, Bystolic  5 mg daily and change losartan  to losartan  HCT 100-12.5 mg daily Well-controlled diabetes with hemoglobin A1c better than before at 7.2 Not taking Jardiance , too expensive Continues metformin  1000 mg twice a day Eating better.  Staying physically active. Cardiovascular risks associated with diabetes and hypertension discussed Diet and nutrition discussed Follow-up in 6 months       Relevant Medications    losartan -hydrochlorothiazide  (HYZAAR) 100-12.5 MG tablet     Digestive   Esophageal stricture   Much improved  after the esophageal dilatation procedure Eating better with significant improvement swallowing        Endocrine   Dyslipidemia associated with type 2 diabetes mellitus (HCC)   Hemoglobin A1c at 7.2 Continue metformin  1000 mg twice a day.  Not taking Jardiance  due to cost. Diet and nutrition discussed Cardiovascular risks associated with diabetes discussed      Relevant Medications   losartan -hydrochlorothiazide  (HYZAAR) 100-12.5 MG tablet   Other Relevant Orders   POCT HgB A1C (Completed)     Genitourinary   Stage 3a chronic kidney disease (HCC)   Advised to stay well-hydrated and avoid NSAIDs Recommend Jardiance  10 mg daily but cost prohibitive as per patient      Patient Instructions  Hypertension, Adult High blood pressure (hypertension) is when the force of blood pumping through the arteries is too strong. The arteries are the blood vessels that carry blood from the heart throughout the body. Hypertension forces the heart to work harder to pump blood and may cause arteries to become narrow or stiff. Untreated or uncontrolled hypertension can lead to a heart attack, heart failure, a stroke, kidney disease, and other problems. A blood pressure reading consists of a higher number over a lower number. Ideally, your blood pressure should be below 120/80. The first ("top") number is called the systolic pressure. It is a measure of the pressure in your arteries as your heart beats. The second ("bottom") number is called the diastolic pressure. It is a measure of the pressure in your arteries as the heart relaxes. What are the causes? The exact cause of this condition is not known. There are some conditions that result in high blood pressure. What increases the risk? Certain factors may make you more likely to develop high blood pressure. Some of these risk factors are under  your control, including: Smoking. Not getting enough exercise or physical activity. Being overweight. Having too much fat, sugar, calories, or salt (sodium) in your diet. Drinking too much alcohol. Other risk factors include: Having a personal history of heart disease, diabetes, high cholesterol, or kidney disease. Stress. Having a family history of high blood pressure and high cholesterol. Having obstructive sleep apnea. Age. The risk increases with age. What are the signs or symptoms? High blood pressure may not cause symptoms. Very high blood pressure (hypertensive crisis) may cause: Headache. Fast or irregular heartbeats (palpitations). Shortness of breath. Nosebleed. Nausea and vomiting. Vision changes. Severe chest pain, dizziness, and seizures. How is this diagnosed? This condition is diagnosed by measuring your blood pressure while you are seated, with your arm resting on a flat surface, your legs uncrossed, and your feet flat on the floor. The cuff of the blood pressure monitor will be placed directly against the skin of your upper arm at the level of your heart. Blood pressure should be measured at least twice using the same arm. Certain conditions can cause a difference in blood pressure between your right and left arms. If you have a high blood pressure reading during one visit or you have normal blood pressure with other risk factors, you may be asked to: Return on a different day to have your blood pressure checked again. Monitor your blood pressure at home for 1 week or longer. If you are diagnosed with hypertension, you may have other blood or imaging tests to help your health care provider understand your overall risk for other conditions. How is this treated? This condition is treated by making healthy lifestyle changes, such as eating healthy  foods, exercising more, and reducing your alcohol intake. You may be referred for counseling on a healthy diet and physical  activity. Your health care provider may prescribe medicine if lifestyle changes are not enough to get your blood pressure under control and if: Your systolic blood pressure is above 130. Your diastolic blood pressure is above 80. Your personal target blood pressure may vary depending on your medical conditions, your age, and other factors. Follow these instructions at home: Eating and drinking  Eat a diet that is high in fiber and potassium, and low in sodium, added sugar, and fat. An example of this eating plan is called the DASH diet. DASH stands for Dietary Approaches to Stop Hypertension. To eat this way: Eat plenty of fresh fruits and vegetables. Try to fill one half of your plate at each meal with fruits and vegetables. Eat whole grains, such as whole-wheat pasta, brown rice, or whole-grain bread. Fill about one fourth of your plate with whole grains. Eat or drink low-fat dairy products, such as skim milk or low-fat yogurt. Avoid fatty cuts of meat, processed or cured meats, and poultry with skin. Fill about one fourth of your plate with lean proteins, such as fish, chicken without skin, beans, eggs, or tofu. Avoid pre-made and processed foods. These tend to be higher in sodium, added sugar, and fat. Reduce your daily sodium intake. Many people with hypertension should eat less than 1,500 mg of sodium a day. Do not drink alcohol if: Your health care provider tells you not to drink. You are pregnant, may be pregnant, or are planning to become pregnant. If you drink alcohol: Limit how much you have to: 0-1 drink a day for women. 0-2 drinks a day for men. Know how much alcohol is in your drink. In the [place] one drink equals one 12 oz bottle of beer (355 mL), one 5 oz glass of wine (148 mL), or one 1 oz glass of hard liquor (44 mL). Lifestyle  Work with your health care provider to maintain a healthy body weight or to lose weight. Ask what an ideal weight is for you. Get at least 30  minutes of exercise that causes your heart to beat faster (aerobic exercise) most days of the week. Activities may include walking, swimming, or biking. Include exercise to strengthen your muscles (resistance exercise), such as Pilates or lifting weights, as part of your weekly exercise routine. Try to do these types of exercises for 30 minutes at least 3 days a week. Do not use any products that contain nicotine or tobacco. These products include cigarettes, chewing tobacco, and vaping devices, such as e-cigarettes. If you need help quitting, ask your health care provider. Monitor your blood pressure at home as told by your health care provider. Keep all follow-up visits. This is important. Medicines Take over-the-counter and prescription medicines only as told by your health care provider. Follow directions carefully. Blood pressure medicines must be taken as prescribed. Do not skip doses of blood pressure medicine. Doing this puts you at risk for problems and can make the medicine less effective. Ask your health care provider about side effects or reactions to medicines that you should watch for. Contact a health care provider if you: Think you are having a reaction to a medicine you are taking. Have headaches that keep coming back (recurring). Feel dizzy. Have swelling in your ankles. Have trouble with your vision. Get help right away if you: Develop a severe headache or confusion. Have unusual weakness  or numbness. Feel faint. Have severe pain in your chest or abdomen. Vomit repeatedly. Have trouble breathing. These symptoms may be an emergency. Get help right away. Call 911. Do not wait to see if the symptoms will go away. Do not drive yourself to the hospital. Summary Hypertension is when the force of blood pumping through your arteries is too strong. If this condition is not controlled, it may put you at risk for serious complications. Your personal target blood pressure may vary  depending on your medical conditions, your age, and other factors. For most people, a normal blood pressure is less than 120/80. Hypertension is treated with lifestyle changes, medicines, or a combination of both. Lifestyle changes include losing weight, eating a healthy, low-sodium diet, exercising more, and limiting alcohol. This information is not intended to replace advice given to you by your health care provider. Make sure you discuss any questions you have with your health care provider. Document Revised: 01/16/2021 Document Reviewed: 01/16/2021 Elsevier Patient Education  2024 Elsevier Inc.     Maryagnes Small, MD [facility]

## 2023-07-29 NOTE — Assessment & Plan Note (Signed)
 Hemoglobin A1c at 7.2 Continue metformin  1000 mg twice a day.  Not taking Jardiance  due to cost. Diet and nutrition discussed Cardiovascular risks associated with diabetes discussed

## 2023-07-29 NOTE — Assessment & Plan Note (Signed)
 Advised to stay well-hydrated and avoid NSAIDs Recommend Jardiance  10 mg daily but cost prohibitive as per patient

## 2023-07-29 NOTE — Assessment & Plan Note (Signed)
 BP Readings from Last 3 Encounters:  07/29/23 (!) 158/62  06/09/23 (!) 169/56  03/31/23 (!) 154/60  Blood pressure still not optimally controlled Recommend to continue amlodipine  10 mg daily, Bystolic  5 mg daily and change losartan  to losartan  HCT 100-12.5 mg daily Well-controlled diabetes with hemoglobin A1c better than before at 7.2 Not taking Jardiance , too expensive Continues metformin  1000 mg twice a day Eating better.  Staying physically active. Cardiovascular risks associated with diabetes and hypertension discussed Diet and nutrition discussed Follow-up in 6 months

## 2023-08-31 ENCOUNTER — Other Ambulatory Visit: Payer: Self-pay | Admitting: Emergency Medicine

## 2023-08-31 DIAGNOSIS — I10 Essential (primary) hypertension: Secondary | ICD-10-CM

## 2023-12-05 ENCOUNTER — Other Ambulatory Visit: Payer: Self-pay | Admitting: Emergency Medicine

## 2023-12-05 DIAGNOSIS — E1159 Type 2 diabetes mellitus with other circulatory complications: Secondary | ICD-10-CM

## 2023-12-05 DIAGNOSIS — E1165 Type 2 diabetes mellitus with hyperglycemia: Secondary | ICD-10-CM

## 2024-01-07 ENCOUNTER — Other Ambulatory Visit: Payer: Self-pay | Admitting: Emergency Medicine

## 2024-01-07 DIAGNOSIS — I152 Hypertension secondary to endocrine disorders: Secondary | ICD-10-CM

## 2024-01-12 DIAGNOSIS — H401131 Primary open-angle glaucoma, bilateral, mild stage: Secondary | ICD-10-CM | POA: Diagnosis not present

## 2024-07-28 ENCOUNTER — Ambulatory Visit
